# Patient Record
Sex: Female | Born: 1985 | Race: White | Hispanic: No | Marital: Married | Smoking: Never smoker
Health system: Southern US, Community
[De-identification: ages and names within clinical notes are randomized; demographics above are authoritative.]

## PROBLEM LIST (undated history)

## (undated) DIAGNOSIS — I341 Nonrheumatic mitral (valve) prolapse: Secondary | ICD-10-CM

## (undated) DIAGNOSIS — I493 Ventricular premature depolarization: Secondary | ICD-10-CM

## (undated) HISTORY — PX: TONSILLECTOMY: SUR1361

## (undated) HISTORY — PX: AUGMENTATION MAMMAPLASTY: SUR837

---

## 2007-07-28 ENCOUNTER — Emergency Department: Payer: Self-pay | Admitting: Emergency Medicine

## 2007-07-28 ENCOUNTER — Other Ambulatory Visit: Payer: Self-pay

## 2007-07-30 ENCOUNTER — Ambulatory Visit: Payer: Self-pay | Admitting: Cardiology

## 2007-08-05 ENCOUNTER — Ambulatory Visit: Payer: Self-pay | Admitting: Cardiology

## 2007-08-05 ENCOUNTER — Encounter: Payer: Self-pay | Admitting: Cardiology

## 2007-08-21 ENCOUNTER — Ambulatory Visit: Payer: Self-pay | Admitting: Cardiology

## 2010-09-05 NOTE — Assessment & Plan Note (Signed)
Kathy Knight   Kathy Knight, Kathy Knight                       MRN:          [id number]  DATE:08/21/2007                            DOB:          12-17-1985    Ms. Kathy Knight comes in today for further management of her palpitations  and hypokalemia.   We obtained a 2-D echocardiogram which shows minimal mitral valve  prolapse of the posterior leaflet.  There was no significant mitral  regurgitation.   She has had very few palpitations since we last saw her.  We will check  a potassium level today.  There is no reason for her to be hypokalemia,  but she was when she went the ED at [facility].   PHYSICAL EXAMINATION:  Her blood pressure is 113/70.  Her pulse is 87  and regular.  Weight is 120.  The rest of her exam is unchanged.   I have spent time discussing mitral valve prolapse, its implications,  its association with palpitations and also her hypokalemia.  All  questions were answered.  SBE prophylaxis was not recommended.  We  specifically discussed that pregnancy should not be a problem, since she  is getting ready to get married.  We will check a potassium level today.     Thomas C. Daleen Squibb, MD, [facility]  Electronically Signed    TCW/MedQ  DD: 08/21/2007  DT: 08/21/2007  Job #: [id number]   cc:   Garey Ham MD

## 2010-09-05 NOTE — Assessment & Plan Note (Signed)
[facility] OFFICE NOTE   Kathy, Knight                       MRN:          [id number]  DATE:07/30/2007                            DOB:          1986/04/15    I was asked by Dr. Garey Ham to evaluate Kathy Knight for  palpitations.   She is 25 years of age, single white female recent graduate of [employer]  nursing school.  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   This past weekend she began to have palpitations.  She took her  stethoscope and listened, and it sounded like she was having PVCs.   She went to the emergency department at [facility].  I do not  have telemetry strips, but she was told that she was having frequent  unifocal PVCs.  There were no couplets or triplets, and no nonsustained  V-tach.   She has never had these before.  She has not had a history of any  cardiac disease.  No history of mitral valve prolapse.  She exercises on  a regular basis, walking her dog, and has no symptoms.   She had no recent URI, cold, infection.  She denies orthopnea, PND or  peripheral edema.  She had no syncope or presyncope.   Her laboratory data at [facility] was within normal limits,  including a CBC, electrolytes, except for her potassium was low at 3.3.  She had a TSH which was normal.  Her D-dimer was negative.  Pregnancy  test was negative and her chest x-ray showed no acute cardiopulmonary  disease.  EKG shows sinus tachycardia with occasional unifocal PVC.   PAST MEDICAL HISTORY:  Other than the above, she does not smoke, does  not drink, does not use recreational drugs.  She walks on a regular  basis.  She quit drinking caffeine since this event occurred.  She was  not heavy caffeine drinker.   SURGICAL HISTORY:  Tonsillectomy 1991, breast augmentation December  2008.   FAMILY HISTORY:  Negative for premature coronary disease or sudden  cardiac  death.   CURRENT MEDS:  Birth control only.   She is a Engineer, civil (consulting).  She has no children.   EXAM:  Her blood pressure is 104/78, her pulse is 100 and regular.  Her  EKG confirms sinus rhythm which is almost sinus tach.  There are no  acute changes.  Her PR, QRS, and QTC intervals are normal.  She is 5  feet three inches and weighs 120 pounds.  She is very pleasant.  HEENT:  Normocephalic, atraumatic.  PERRLA.  Extraocular is intact.  Sclerae are clear.  Facial symmetry is  normal.  Dentition satisfactory.  Neck is supple.  Carotid upstrokes are equal bilateral without bruits,  no JVD.  Thyroid is not enlarged.  Trachea is midline.  LUNGS:  Clear.  HEART:  Reveals a nondisplaced PMI.  Normal S1-S2 without murmur, gallop  or click.  Abdominal exam is soft, good bowel sounds.  No midline bruit.  EXTREMITIES:  No cyanosis, clubbing or edema.  Pulses are intact.  NEURO:  Exam is intact.  Skin is unremarkable.   ASSESSMENT:  1. New-onset, most likely benign palpitations rule out underlying      structural heart disease.  2. Hypokalemia.  This could lower the threshold for palpitations,      particularly in the setting of caffeine.   PLAN:  1. Potassium rich diet.  2. Follow up potassium when she follows with me in the office.  3. 2-D echocardiogram to out a structural heart disease.  4. Regular exercise, regular meals, and regular sleep.  5. Avoid stimulants.     Thomas C. Daleen Squibb, MD, [facility]  Electronically Signed    TCW/MedQ  DD: 07/30/2007  DT: 07/30/2007  Job #: [id number]   cc:   Dr. Garey Ham

## 2012-12-04 ENCOUNTER — Inpatient Hospital Stay: Payer: Self-pay | Admitting: Obstetrics & Gynecology

## 2012-12-04 LAB — CBC WITH DIFFERENTIAL/PLATELET
Eosinophil %: 0.1 %
HGB: 11.2 g/dL — ABNORMAL LOW (ref 12.0–16.0)
MCV: 75 fL — ABNORMAL LOW (ref 80–100)
Monocyte #: 0.8 x10 3/mm (ref 0.2–0.9)
Neutrophil #: 10.4 10*3/uL — ABNORMAL HIGH (ref 1.4–6.5)
RBC: 4.36 10*6/uL (ref 3.80–5.20)
RDW: 14.3 % (ref 11.5–14.5)
WBC: 13 10*3/uL — ABNORMAL HIGH (ref 3.6–11.0)

## 2014-07-28 ENCOUNTER — Encounter: Payer: Self-pay | Admitting: *Deleted

## 2014-08-31 NOTE — H&P (Signed)
L&D Evaluation:  History:  HPI 29 yo G1 at [redacted]w[redacted]d by [facility] of 12/19/2012 presenting with PROM at 23:00 on 12/03/12. +FM, no VB, +ctx  PNC uncomplicated.  O neg s/p rhogam at 28 weeks / RI / VZNI / HBsAg neg / RPR NR / HIV neg / 1-hr OGTT 119 / GBS negative   Presents with leaking fluid   Patient's Medical History No Chronic Illness   Patient's Surgical History breast augmentation bilaterally   Medications Pre Natal Vitamins   Allergies NKDA   Social History none   Family History Non-Contributory   ROS:  ROS All systems were reviewed.  HEENT, CNS, GI, GU, Respiratory, CV, Renal and Musculoskeletal systems were found to be normal.   Exam:  Vital Signs stable   Urine Protein not completed   General no apparent distress   Mental Status no increased work of breathing   Abdomen gravid, non-tender   Estimated Fetal Weight Average for gestational age   Back no CVAT   Edema no edema   Pelvic no external lesions, 1cm   Mebranes Ruptured   FHT normal rate with no decels   Ucx regular, q49min   Impression:  Impression PPROM pitocin augmentation   Plan:  Plan EFM/NST   Comments cervix rechecked 4-hr after admission 2.5/70/-2 continue pitocin   Electronic Signatures: Dorthula Nettles (MD)  (Signed 14-Aug-14 12:59)  Authored: L&D Evaluation   Last Updated: 14-Aug-14 12:59 by Dorthula Nettles (MD)

## 2015-11-25 DIAGNOSIS — Z01419 Encounter for gynecological examination (general) (routine) without abnormal findings: Secondary | ICD-10-CM | POA: Diagnosis not present

## 2015-11-25 DIAGNOSIS — Z124 Encounter for screening for malignant neoplasm of cervix: Secondary | ICD-10-CM | POA: Diagnosis not present

## 2015-11-25 LAB — HM PAP SMEAR: HM PAP: NEGATIVE

## 2015-12-16 DIAGNOSIS — N949 Unspecified condition associated with female genital organs and menstrual cycle: Secondary | ICD-10-CM | POA: Diagnosis not present

## 2016-02-02 DIAGNOSIS — Z3481 Encounter for supervision of other normal pregnancy, first trimester: Secondary | ICD-10-CM | POA: Diagnosis not present

## 2016-02-02 LAB — OB RESULTS CONSOLE HGB/HCT, BLOOD
HCT: 36 %
HEMOGLOBIN: 12.5 g/dL

## 2016-02-02 LAB — OB RESULTS CONSOLE HEPATITIS B SURFACE ANTIGEN: HEP B S AG: NEGATIVE

## 2016-02-02 LAB — OB RESULTS CONSOLE RPR: RPR: NONREACTIVE

## 2016-02-02 LAB — OB RESULTS CONSOLE ANTIBODY SCREEN: Antibody Screen: NEGATIVE

## 2016-02-02 LAB — SICKLE CELL SCREEN: Sickle Cell Screen: NORMAL

## 2016-02-02 LAB — OB RESULTS CONSOLE VARICELLA ZOSTER ANTIBODY, IGG: VARICELLA IGG: IMMUNE

## 2016-02-02 LAB — OB RESULTS CONSOLE HIV ANTIBODY (ROUTINE TESTING): HIV: NONREACTIVE

## 2016-02-02 LAB — OB RESULTS CONSOLE ABO/RH: RH Type: NEGATIVE

## 2016-02-02 LAB — OB RESULTS CONSOLE RUBELLA ANTIBODY, IGM: Rubella: IMMUNE

## 2016-02-02 LAB — OB RESULTS CONSOLE PLATELET COUNT: Platelets: 237 10*3/uL

## 2016-02-02 LAB — OB RESULTS CONSOLE GC/CHLAMYDIA
Chlamydia: NEGATIVE
Gonorrhea: NEGATIVE

## 2016-02-08 DIAGNOSIS — Z3A09 9 weeks gestation of pregnancy: Secondary | ICD-10-CM | POA: Diagnosis not present

## 2016-02-08 DIAGNOSIS — O200 Threatened abortion: Secondary | ICD-10-CM | POA: Diagnosis not present

## 2016-02-08 DIAGNOSIS — O360110 Maternal care for anti-D [Rh] antibodies, first trimester, not applicable or unspecified: Secondary | ICD-10-CM | POA: Diagnosis not present

## 2016-02-08 DIAGNOSIS — O26851 Spotting complicating pregnancy, first trimester: Secondary | ICD-10-CM | POA: Diagnosis not present

## 2016-02-21 DIAGNOSIS — H5213 Myopia, bilateral: Secondary | ICD-10-CM | POA: Diagnosis not present

## 2016-02-29 DIAGNOSIS — O43891 Other placental disorders, first trimester: Secondary | ICD-10-CM | POA: Diagnosis not present

## 2016-02-29 DIAGNOSIS — Z3A12 12 weeks gestation of pregnancy: Secondary | ICD-10-CM | POA: Diagnosis not present

## 2016-04-12 ENCOUNTER — Ambulatory Visit (HOSPITAL_BASED_OUTPATIENT_CLINIC_OR_DEPARTMENT_OTHER)
Admission: RE | Admit: 2016-04-12 | Discharge: 2016-04-12 | Disposition: A | Discharge status: Home / Self Care | Payer: 59 | Source: Ambulatory Visit | Attending: Maternal and Fetal Medicine | Admitting: Maternal and Fetal Medicine

## 2016-04-12 ENCOUNTER — Other Ambulatory Visit: Payer: Self-pay | Admitting: *Deleted

## 2016-04-12 ENCOUNTER — Ambulatory Visit
Admission: RE | Admit: 2016-04-12 | Discharge: 2016-04-12 | Disposition: A | Discharge status: Home / Self Care | Payer: Self-pay | Source: Ambulatory Visit | Attending: Maternal and Fetal Medicine | Admitting: Maternal and Fetal Medicine

## 2016-04-12 ENCOUNTER — Other Ambulatory Visit: Payer: Self-pay

## 2016-04-12 DIAGNOSIS — Z3689 Encounter for other specified antenatal screening: Secondary | ICD-10-CM

## 2016-04-12 DIAGNOSIS — O9989 Other specified diseases and conditions complicating pregnancy, childbirth and the puerperium: Secondary | ICD-10-CM | POA: Diagnosis not present

## 2016-04-12 DIAGNOSIS — Q790 Congenital diaphragmatic hernia: Secondary | ICD-10-CM | POA: Insufficient documentation

## 2016-04-12 DIAGNOSIS — O99891 Other specified diseases and conditions complicating pregnancy: Secondary | ICD-10-CM

## 2016-04-12 HISTORY — DX: Nonrheumatic mitral (valve) prolapse: I34.1

## 2016-04-12 HISTORY — DX: Ventricular premature depolarization: I49.3

## 2016-04-20 LAB — INFORMASEQ(SM) WITH XY ANALYSIS
FETAL NUMBER: 1
Fetal Fraction (%):: 19.8
Gestational Age at Collection: 18.3 weeks
PDF: 0
Weight: 124 [lb_av]

## 2016-04-27 ENCOUNTER — Telehealth: Payer: Self-pay | Admitting: Obstetrics and Gynecology

## 2016-04-27 NOTE — Addendum Note (Signed)
Encounter addended by: Donette Larry on: 04/27/2016 11:06 AM<BR>    Actions taken: Sign clinical note

## 2016-04-27 NOTE — Telephone Encounter (Signed)
See genetic counseling note from 04/12/2016 for detailed notes on these results.  Normal InformaSeq results were given to the patient by phone on 04/24/2016.  Though these results are normal, we cannot rule out these or other chromosome conditions or genetic syndromes in this pregnancy. Kathy Knight has transferred care to [facility]FM due to the finding of [facility] in this pregnancy.  Future notes about this pregnancy can be found in the [facility].  We can be reached at [phone].     Wilburt Finlay, MS, CGC

## 2016-04-27 NOTE — Progress Notes (Signed)
Referring Physician:  [facility] Length of Consultation: 30 minutes  At the time of her visit, Ms. Hennen and her husband met with Dr. 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  The ultrasound showed a left sided [facility] with the heart shifted to the right and the stomach in the thorax.  Within the limits of the ultrasound, all other fetal anatomy appeared normal. Dr. Ysidro Evert spoke at length about the effects of this condition on fetal development and the health concerns/treatment after delivery.  We talked about the association of [facility] with chromosome conditions in 10-20% of cases as well as the possibility of other genetic syndromes or associated birth defects.  The option of amniocentesis with chromosome analysis and microarray was offered, as was the option of cell free DNA testing to screen for trisomy 13, 18 and 21.  The patient was counseled about the risks, benefits and limitations of both of these testing options and elected to proceed with InformaSeq cell free DNA testing.  She declined amniocentesis at this time due to the risks of the procedure.  The couple reported no exposures or complications in the pregnancy which would be expected to increase the risk for birth defects.  A full three generation pedigree was not obtained, but the couple stated that there was no known family history of birth defects, genetic syndromes, recurrent pregnancy loss or developmental differences.  Management and surveillance for the remainder of the pregnancy was also reviewed with the couple.  They intend to continue to pregnancy and would not elected to end the pregnancy due to the diagnosis or any of the syndromes we discussed.  They would like to transfer care to [facility] for the remainder of the pregnancy, which was facilitated at this visit.  We would recommend a fetal echocardiogram after [redacted] weeks gestation, a  consultation with pediatric surgery and a fetal MRI later in the pregnancy to better assess lung development and prognosis.  Another ultrasound assessing fetal anatomy will likely be performed at the [facility] upon transfer of her care.  The patient was informed of the results of her recent InformaSeq testing (performed at [facility]) which yielded NEGATIVE results.  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  The sensitivity for trisomy 65, trisomy 39 and trisomy 38 using this testing are reported as 99.1%, 98.3% and 98.1% respectively.  Thus, while the results of this testing are highly accurate, they are not considered diagnostic at this time.  Should more definitive information be desired, the patient may still consider amniocentesis. As requested to know by the patient, sex chromosome analysis was included for this sample.  Results were consistent with a female fetus. This is predicted with >97% accuracy.  A maternal serum AFP only should be considered if screening for neural tube defects is desired.  We reviewed that though these screening test results are normal, which significantly reduces the chance for trisomy 3, 18 or 21 in this pregnancy, this testing cannot rule out these conditions completely, and it cannot assess the chance for other chromosome conditions that may be associated with congenital diaphragmatic hernia.  The option of amniocentesis with chromosome analysis and chromosomal microarray remains available if desired.  We can be reached at [phone].     Wilburt Finlay, MS, CGC

## 2016-05-04 DIAGNOSIS — Z3A21 21 weeks gestation of pregnancy: Secondary | ICD-10-CM | POA: Diagnosis not present

## 2016-05-04 DIAGNOSIS — Q790 Congenital diaphragmatic hernia: Secondary | ICD-10-CM | POA: Diagnosis not present

## 2016-05-04 DIAGNOSIS — Z3688 Encounter for antenatal screening for fetal macrosomia: Secondary | ICD-10-CM | POA: Diagnosis not present

## 2016-05-18 DIAGNOSIS — O358XX0 Maternal care for other (suspected) fetal abnormality and damage, not applicable or unspecified: Secondary | ICD-10-CM | POA: Diagnosis not present

## 2016-05-18 DIAGNOSIS — Q790 Congenital diaphragmatic hernia: Secondary | ICD-10-CM | POA: Diagnosis not present

## 2016-05-18 DIAGNOSIS — Z3A23 23 weeks gestation of pregnancy: Secondary | ICD-10-CM | POA: Diagnosis not present

## 2016-05-18 DIAGNOSIS — O359XX1 Maternal care for (suspected) fetal abnormality and damage, unspecified, fetus 1: Secondary | ICD-10-CM | POA: Diagnosis not present

## 2016-05-22 ENCOUNTER — Encounter: Payer: Self-pay | Admitting: Maternal and Fetal Medicine

## 2016-05-30 DIAGNOSIS — O358XX0 Maternal care for other (suspected) fetal abnormality and damage, not applicable or unspecified: Secondary | ICD-10-CM | POA: Diagnosis not present

## 2016-05-30 DIAGNOSIS — O368920 Maternal care for other specified fetal problems, second trimester, not applicable or unspecified: Secondary | ICD-10-CM | POA: Diagnosis not present

## 2016-05-30 DIAGNOSIS — O360920 Maternal care for other rhesus isoimmunization, second trimester, not applicable or unspecified: Secondary | ICD-10-CM | POA: Diagnosis not present

## 2016-05-31 DIAGNOSIS — O358XX0 Maternal care for other (suspected) fetal abnormality and damage, not applicable or unspecified: Secondary | ICD-10-CM | POA: Diagnosis not present

## 2016-05-31 DIAGNOSIS — O368920 Maternal care for other specified fetal problems, second trimester, not applicable or unspecified: Secondary | ICD-10-CM | POA: Diagnosis not present

## 2016-05-31 DIAGNOSIS — Q790 Congenital diaphragmatic hernia: Secondary | ICD-10-CM | POA: Diagnosis not present

## 2016-05-31 DIAGNOSIS — Z3A25 25 weeks gestation of pregnancy: Secondary | ICD-10-CM | POA: Diagnosis not present

## 2016-06-07 LAB — INFORMASEQ(SM) WITH XY ANALYSIS
FETAL FRACTION (%): 19.8
Fetal Number: 1
GESTATIONAL AGE AT COLLECTION: 18.3 wk
WEIGHT: 124 [lb_av]

## 2016-06-18 DIAGNOSIS — O0993 Supervision of high risk pregnancy, unspecified, third trimester: Secondary | ICD-10-CM | POA: Diagnosis not present

## 2016-06-18 DIAGNOSIS — O358XX0 Maternal care for other (suspected) fetal abnormality and damage, not applicable or unspecified: Secondary | ICD-10-CM | POA: Diagnosis not present

## 2016-06-18 DIAGNOSIS — Q790 Congenital diaphragmatic hernia: Secondary | ICD-10-CM | POA: Diagnosis not present

## 2016-06-18 DIAGNOSIS — O09893 Supervision of other high risk pregnancies, third trimester: Secondary | ICD-10-CM | POA: Diagnosis not present

## 2016-06-18 DIAGNOSIS — Z3A28 28 weeks gestation of pregnancy: Secondary | ICD-10-CM | POA: Diagnosis not present

## 2016-06-18 DIAGNOSIS — Z363 Encounter for antenatal screening for malformations: Secondary | ICD-10-CM | POA: Diagnosis not present

## 2016-06-18 DIAGNOSIS — O9989 Other specified diseases and conditions complicating pregnancy, childbirth and the puerperium: Secondary | ICD-10-CM | POA: Diagnosis not present

## 2016-06-18 DIAGNOSIS — Z23 Encounter for immunization: Secondary | ICD-10-CM | POA: Diagnosis not present

## 2016-06-18 DIAGNOSIS — O360130 Maternal care for anti-D [Rh] antibodies, third trimester, not applicable or unspecified: Secondary | ICD-10-CM | POA: Diagnosis not present

## 2016-06-18 DIAGNOSIS — Z3A27 27 weeks gestation of pregnancy: Secondary | ICD-10-CM | POA: Diagnosis not present

## 2016-06-27 DIAGNOSIS — O358XX1 Maternal care for other (suspected) fetal abnormality and damage, fetus 1: Secondary | ICD-10-CM | POA: Diagnosis not present

## 2016-06-27 DIAGNOSIS — Q790 Congenital diaphragmatic hernia: Secondary | ICD-10-CM | POA: Diagnosis not present

## 2016-06-29 DIAGNOSIS — O358XX0 Maternal care for other (suspected) fetal abnormality and damage, not applicable or unspecified: Secondary | ICD-10-CM | POA: Diagnosis not present

## 2016-06-29 DIAGNOSIS — O359XX1 Maternal care for (suspected) fetal abnormality and damage, unspecified, fetus 1: Secondary | ICD-10-CM | POA: Diagnosis not present

## 2016-06-30 DIAGNOSIS — O35FXX1 Maternal care for other (suspected) fetal abnormality and damage, fetal musculoskeletal anomalies of trunk, fetus 1: Secondary | ICD-10-CM | POA: Insufficient documentation

## 2016-07-18 DIAGNOSIS — O358XX0 Maternal care for other (suspected) fetal abnormality and damage, not applicable or unspecified: Secondary | ICD-10-CM | POA: Diagnosis not present

## 2016-07-18 DIAGNOSIS — O09893 Supervision of other high risk pregnancies, third trimester: Secondary | ICD-10-CM | POA: Diagnosis not present

## 2016-07-18 DIAGNOSIS — Z3A21 21 weeks gestation of pregnancy: Secondary | ICD-10-CM | POA: Diagnosis not present

## 2016-07-18 DIAGNOSIS — Q790 Congenital diaphragmatic hernia: Secondary | ICD-10-CM | POA: Diagnosis not present

## 2016-07-18 DIAGNOSIS — Z3A32 32 weeks gestation of pregnancy: Secondary | ICD-10-CM | POA: Diagnosis not present

## 2016-07-18 DIAGNOSIS — O358XX1 Maternal care for other (suspected) fetal abnormality and damage, fetus 1: Secondary | ICD-10-CM | POA: Diagnosis not present

## 2016-07-27 DIAGNOSIS — O358XX0 Maternal care for other (suspected) fetal abnormality and damage, not applicable or unspecified: Secondary | ICD-10-CM | POA: Diagnosis not present

## 2016-07-27 DIAGNOSIS — Z363 Encounter for antenatal screening for malformations: Secondary | ICD-10-CM | POA: Diagnosis not present

## 2016-07-27 DIAGNOSIS — O403XX0 Polyhydramnios, third trimester, not applicable or unspecified: Secondary | ICD-10-CM | POA: Diagnosis not present

## 2016-07-27 DIAGNOSIS — Z3A33 33 weeks gestation of pregnancy: Secondary | ICD-10-CM | POA: Diagnosis not present

## 2016-07-27 DIAGNOSIS — Q897 Multiple congenital malformations, not elsewhere classified: Secondary | ICD-10-CM | POA: Diagnosis not present

## 2016-07-30 ENCOUNTER — Other Ambulatory Visit: Payer: Self-pay | Admitting: *Deleted

## 2016-07-30 DIAGNOSIS — O9989 Other specified diseases and conditions complicating pregnancy, childbirth and the puerperium: Principal | ICD-10-CM

## 2016-07-30 DIAGNOSIS — Q790 Congenital diaphragmatic hernia: Secondary | ICD-10-CM

## 2016-08-01 DIAGNOSIS — G43009 Migraine without aura, not intractable, without status migrainosus: Secondary | ICD-10-CM

## 2016-08-01 DIAGNOSIS — Z6741 Type O blood, Rh negative: Secondary | ICD-10-CM | POA: Insufficient documentation

## 2016-08-02 ENCOUNTER — Ambulatory Visit
Admission: RE | Admit: 2016-08-02 | Discharge: 2016-08-02 | Disposition: A | Discharge status: Home / Self Care | Payer: 59 | Source: Ambulatory Visit | Attending: Obstetrics & Gynecology | Admitting: Obstetrics & Gynecology

## 2016-08-02 DIAGNOSIS — Z6741 Type O blood, Rh negative: Secondary | ICD-10-CM

## 2016-08-02 DIAGNOSIS — Q790 Congenital diaphragmatic hernia: Secondary | ICD-10-CM | POA: Diagnosis not present

## 2016-08-02 DIAGNOSIS — O9989 Other specified diseases and conditions complicating pregnancy, childbirth and the puerperium: Secondary | ICD-10-CM | POA: Diagnosis not present

## 2016-08-02 DIAGNOSIS — Z3A34 34 weeks gestation of pregnancy: Secondary | ICD-10-CM | POA: Diagnosis not present

## 2016-08-02 DIAGNOSIS — O99891 Other specified diseases and conditions complicating pregnancy: Secondary | ICD-10-CM | POA: Insufficient documentation

## 2016-08-02 NOTE — Progress Notes (Signed)
[facility] presents for fetal testing in setting of pregnancy complicated by [facility]. She is moving to [place] as she plans to deliver at [facility] in [place].  Good FM. See [place] report for AFI.  NST: Baseline 125, moderate variability, reactive, no decels No contractions.  Saulo Anthis, [place] A, MD

## 2016-08-06 DIAGNOSIS — O403XX0 Polyhydramnios, third trimester, not applicable or unspecified: Secondary | ICD-10-CM | POA: Diagnosis not present

## 2016-08-06 DIAGNOSIS — O358XX1 Maternal care for other (suspected) fetal abnormality and damage, fetus 1: Secondary | ICD-10-CM | POA: Diagnosis not present

## 2016-08-06 DIAGNOSIS — O360930 Maternal care for other rhesus isoimmunization, third trimester, not applicable or unspecified: Secondary | ICD-10-CM | POA: Diagnosis not present

## 2016-08-06 DIAGNOSIS — O358XX0 Maternal care for other (suspected) fetal abnormality and damage, not applicable or unspecified: Secondary | ICD-10-CM | POA: Diagnosis not present

## 2016-08-07 DIAGNOSIS — O358XX0 Maternal care for other (suspected) fetal abnormality and damage, not applicable or unspecified: Secondary | ICD-10-CM | POA: Diagnosis not present

## 2016-08-07 DIAGNOSIS — O358XX1 Maternal care for other (suspected) fetal abnormality and damage, fetus 1: Secondary | ICD-10-CM | POA: Diagnosis not present

## 2016-08-13 DIAGNOSIS — Z3A35 35 weeks gestation of pregnancy: Secondary | ICD-10-CM | POA: Diagnosis not present

## 2016-08-13 DIAGNOSIS — O368930 Maternal care for other specified fetal problems, third trimester, not applicable or unspecified: Secondary | ICD-10-CM | POA: Diagnosis not present

## 2016-08-13 DIAGNOSIS — O358XX0 Maternal care for other (suspected) fetal abnormality and damage, not applicable or unspecified: Secondary | ICD-10-CM | POA: Diagnosis not present

## 2016-08-14 DIAGNOSIS — O403XX0 Polyhydramnios, third trimester, not applicable or unspecified: Secondary | ICD-10-CM | POA: Diagnosis not present

## 2016-08-14 DIAGNOSIS — O360930 Maternal care for other rhesus isoimmunization, third trimester, not applicable or unspecified: Secondary | ICD-10-CM | POA: Diagnosis not present

## 2016-08-14 DIAGNOSIS — O99323 Drug use complicating pregnancy, third trimester: Secondary | ICD-10-CM | POA: Diagnosis not present

## 2016-08-14 DIAGNOSIS — O358XX0 Maternal care for other (suspected) fetal abnormality and damage, not applicable or unspecified: Secondary | ICD-10-CM | POA: Diagnosis not present

## 2016-08-14 DIAGNOSIS — O368930 Maternal care for other specified fetal problems, third trimester, not applicable or unspecified: Secondary | ICD-10-CM | POA: Diagnosis not present

## 2016-08-20 DIAGNOSIS — O360930 Maternal care for other rhesus isoimmunization, third trimester, not applicable or unspecified: Secondary | ICD-10-CM | POA: Diagnosis not present

## 2016-08-20 DIAGNOSIS — O358XX0 Maternal care for other (suspected) fetal abnormality and damage, not applicable or unspecified: Secondary | ICD-10-CM | POA: Diagnosis not present

## 2016-08-20 DIAGNOSIS — O368930 Maternal care for other specified fetal problems, third trimester, not applicable or unspecified: Secondary | ICD-10-CM | POA: Diagnosis not present

## 2016-08-20 DIAGNOSIS — O403XX0 Polyhydramnios, third trimester, not applicable or unspecified: Secondary | ICD-10-CM | POA: Diagnosis not present

## 2016-08-21 DIAGNOSIS — O358XX0 Maternal care for other (suspected) fetal abnormality and damage, not applicable or unspecified: Secondary | ICD-10-CM | POA: Diagnosis not present

## 2016-08-21 DIAGNOSIS — D62 Acute posthemorrhagic anemia: Secondary | ICD-10-CM | POA: Diagnosis not present

## 2016-08-21 DIAGNOSIS — O358XX1 Maternal care for other (suspected) fetal abnormality and damage, fetus 1: Secondary | ICD-10-CM | POA: Diagnosis not present

## 2016-08-21 DIAGNOSIS — O9902 Anemia complicating childbirth: Secondary | ICD-10-CM | POA: Diagnosis not present

## 2016-08-21 DIAGNOSIS — Z3A37 37 weeks gestation of pregnancy: Secondary | ICD-10-CM | POA: Diagnosis not present

## 2016-08-21 DIAGNOSIS — O26893 Other specified pregnancy related conditions, third trimester: Secondary | ICD-10-CM | POA: Diagnosis not present

## 2016-08-21 DIAGNOSIS — O368930 Maternal care for other specified fetal problems, third trimester, not applicable or unspecified: Secondary | ICD-10-CM | POA: Diagnosis not present

## 2016-10-03 DIAGNOSIS — O9903 Anemia complicating the puerperium: Secondary | ICD-10-CM | POA: Diagnosis not present

## 2016-10-10 DIAGNOSIS — Z3483 Encounter for supervision of other normal pregnancy, third trimester: Secondary | ICD-10-CM | POA: Diagnosis not present

## 2016-10-10 DIAGNOSIS — Z3482 Encounter for supervision of other normal pregnancy, second trimester: Secondary | ICD-10-CM | POA: Diagnosis not present

## 2016-11-13 ENCOUNTER — Telehealth: Payer: Self-pay | Admitting: Obstetrics & Gynecology

## 2016-11-13 NOTE — Telephone Encounter (Signed)
Pt coming 8/10 at 2:10 with [facility] for mirena insert.

## 2016-11-16 NOTE — Telephone Encounter (Signed)
Noted. Will order to arrive by apt date/time. 

## 2016-11-30 ENCOUNTER — Ambulatory Visit (INDEPENDENT_AMBULATORY_CARE_PROVIDER_SITE_OTHER): Payer: 59 | Admitting: Obstetrics & Gynecology

## 2016-11-30 ENCOUNTER — Encounter: Payer: Self-pay | Admitting: Obstetrics & Gynecology

## 2016-11-30 VITALS — BP 110/76 | HR 88 | Ht 63.0 in | Wt 133.0 lb

## 2016-11-30 DIAGNOSIS — Z975 Presence of (intrauterine) contraceptive device: Secondary | ICD-10-CM

## 2016-11-30 NOTE — Progress Notes (Signed)
  IUD PROCEDURE NOTE:  Kathy Knight is a 31 y.o. [id number] here for IUD insertion. No GYN concerns.  Last pap smear was normal.  Delivered in May, infant receiving care for diaphragmatic hernia.  No further pregnancies desired.  Breast feeding.  IUD Insertion Procedure Note Patient identified, informed consent performed, consent signed.   Discussed risks of irregular bleeding, cramping, infection, malpositioning or misplacement of the IUD outside the uterus which may require further procedure such as laparoscopy, risk of failure <1%. Time out was performed.  Urine pregnancy test negative.  A bimanual exam showed the uterus to be midposition.  Speculum placed in the vagina.  Cervix visualized.  Cleaned with Betadine x 2.  Grasped anteriorly with a single tooth tenaculum.  Uterus sounded to 7 cm.   IUD placed per manufacturer's recommendations.  Strings trimmed to 3 cm. Tenaculum was removed, good hemostasis noted.  Patient tolerated procedure well.   Patient was given post-procedure instructions.  She was advised to have backup contraception for one week.  Patient was also asked to check IUD strings periodically and follow up in 4 weeks for IUD check.  Barnett Applebaum, MD, [facility], [facility] 11/30/2016  2:47 PM

## 2016-12-03 DIAGNOSIS — Z3483 Encounter for supervision of other normal pregnancy, third trimester: Secondary | ICD-10-CM | POA: Diagnosis not present

## 2016-12-03 DIAGNOSIS — Z3482 Encounter for supervision of other normal pregnancy, second trimester: Secondary | ICD-10-CM | POA: Diagnosis not present

## 2016-12-07 ENCOUNTER — Ambulatory Visit: Payer: 59 | Admitting: Psychology

## 2017-01-01 ENCOUNTER — Ambulatory Visit: Payer: 59 | Admitting: Obstetrics & Gynecology

## 2017-01-07 ENCOUNTER — Ambulatory Visit (INDEPENDENT_AMBULATORY_CARE_PROVIDER_SITE_OTHER): Payer: 59 | Admitting: Obstetrics & Gynecology

## 2017-01-07 ENCOUNTER — Encounter: Payer: Self-pay | Admitting: Obstetrics & Gynecology

## 2017-01-07 VITALS — BP 120/80 | HR 81 | Ht 64.0 in | Wt 133.0 lb

## 2017-01-07 DIAGNOSIS — Z30431 Encounter for routine checking of intrauterine contraceptive device: Secondary | ICD-10-CM

## 2017-01-07 NOTE — Progress Notes (Signed)
  History of Present Illness:  Kathy Knight is a 31 y.o. that had a Mirena IUD placed approximately 5 weeks ago. Since that time, she states that she has had intermittant light bleeding  PMHx: She  has a past medical history of Mitral valve prolapse and PVC (premature ventricular contraction). Also,  has a past surgical history that includes Augmentation mammaplasty and Tonsillectomy., family history includes Heart disease in her maternal grandmother and paternal grandmother; Hyperlipidemia in her maternal grandmother, mother, and paternal grandmother; Pulmonary fibrosis in her father; Thyroid disease in her maternal aunt and mother.,  reports that she has never smoked. She has never used smokeless tobacco. She reports that she does not drink alcohol or use drugs. No outpatient prescriptions have been marked as taking for the 01/07/17 encounter (Office Visit) with Gae Dry, MD.  .  Also, has No Known Allergies..  Review of Systems  All other systems reviewed and are negative.  Physical Exam:  BP 120/80   Pulse 81   Ht 5\' 4"  (1.626 m)   Wt 133 lb (60.3 kg)   BMI 22.83 kg/m  Body mass index is 22.83 kg/m. Constitutional: Well nourished, well developed female in no acute distress.  Abdomen: diffusely non tender to palpation, non distended, and no masses, hernias Neuro: Grossly intact Psych:  Normal mood and affect.    Pelvic exam:  Two IUD strings present seen coming from the cervical os. EGBUS, vaginal vault and cervix: within normal limits  Assessment: IUD strings present in proper location; pt doing well  Plan: She was told to continue to use barrier contraception, in order to prevent any STIs, and to take a home pregnancy test or call us if she ever thinks she may be pregnant, and that her IUD expires in 5 years.  She was amenable to this plan and we will see her back in 1 year/PRN.  Barnett Applebaum, MD, [facility], [facility] 01/07/2017  3:30 PM

## 2017-02-20 DIAGNOSIS — Z3483 Encounter for supervision of other normal pregnancy, third trimester: Secondary | ICD-10-CM | POA: Diagnosis not present

## 2017-02-20 DIAGNOSIS — Z3482 Encounter for supervision of other normal pregnancy, second trimester: Secondary | ICD-10-CM | POA: Diagnosis not present

## 2017-04-18 DIAGNOSIS — Z3482 Encounter for supervision of other normal pregnancy, second trimester: Secondary | ICD-10-CM | POA: Diagnosis not present

## 2017-04-18 DIAGNOSIS — Z3483 Encounter for supervision of other normal pregnancy, third trimester: Secondary | ICD-10-CM | POA: Diagnosis not present

## 2017-05-25 ENCOUNTER — Ambulatory Visit
Admission: EM | Admit: 2017-05-25 | Discharge: 2017-05-25 | Disposition: A | Discharge status: Home / Self Care | Payer: 59 | Attending: Emergency Medicine | Admitting: Emergency Medicine

## 2017-05-25 ENCOUNTER — Encounter: Payer: Self-pay | Admitting: Gynecology

## 2017-05-25 ENCOUNTER — Other Ambulatory Visit: Payer: Self-pay

## 2017-05-25 DIAGNOSIS — J0100 Acute maxillary sinusitis, unspecified: Secondary | ICD-10-CM

## 2017-05-25 DIAGNOSIS — M26621 Arthralgia of right temporomandibular joint: Secondary | ICD-10-CM

## 2017-05-25 MED ORDER — HYDROCOD POLST-CPM POLST ER 10-8 MG/5ML PO SUER: 5.0000 mL | Freq: Two times a day (BID) | ORAL | 0 refills | Status: DC | PRN

## 2017-05-25 MED ORDER — DOXYCYCLINE HYCLATE 100 MG PO CAPS: 100.0000 mg | ORAL_CAPSULE | Freq: Two times a day (BID) | ORAL | 0 refills | Status: AC

## 2017-05-25 MED ORDER — PREDNISONE 20 MG PO TABS: 40.0000 mg | ORAL_TABLET | Freq: Every day | ORAL | 0 refills | Status: AC

## 2017-05-25 MED ORDER — BENZONATATE 200 MG PO CAPS: 200.0000 mg | ORAL_CAPSULE | Freq: Three times a day (TID) | ORAL | 0 refills | Status: DC | PRN

## 2017-05-25 MED ORDER — FLUTICASONE PROPIONATE 50 MCG/ACT NA SUSP: 2.0000 | Freq: Every day | NASAL | 0 refills | Status: DC

## 2017-05-25 MED ORDER — CYCLOBENZAPRINE HCL 10 MG PO TABS: 10.0000 mg | ORAL_TABLET | Freq: Every day | ORAL | 0 refills | Status: DC

## 2017-05-25 NOTE — Discharge Instructions (Signed)
Take the medication as written. Start Mucinex to keep the mucous thin and to decongest you.  Return to the ER if you get worse, have a fever >100.4, or for any concerns. You may take 600 mg of motrin with 1 gram of tylenol up to 3-4 times a day as needed for pain. This is an effective combination for pain.  Use a NeilMed sinus rinse as often as you want to to reduce nasal congestion. Follow the directions on the box.   Go to www.goodrx.com to look up your medications. This will give you a list of where you can find your prescriptions at the most affordable prices. Or you can ask the pharmacist what the cash price is. This is frequently cheaper than going through insurance.

## 2017-05-25 NOTE — ED Triage Notes (Signed)
Per patient c/o cough x 10 days. Pt. C/o right ear pain / congestion and sore throat.

## 2017-05-25 NOTE — ED Provider Notes (Addendum)
HPI  SUBJECTIVE:  Kathy Knight is a 32 y.o. female who presents with greenish nasal congestion, rhinorrhea, postnasal drip, sinus pain and pressure, nonproductive cough, chest congestion and dull constant right ear pain that occasionally becomes sharp, stabbing.  This is been going on for 10 days.  States that she was getting better and then got acutely worse several days ago.  She states that her right ear is tender to palpation.  States that she cannot sleep secondary to cough.  She reports headaches, chills, feeling feverish but has no documented fevers.  No upper dental pain.  She has been taking ibuprofen 600 mg on a regular basis with some improvement in her symptoms.  She has also tried Delsym and Robitussin.  Symptoms are worse with lying down.  She denies wheezing, chest pain, shortness of breath, dyspnea on exertion.  No allergy or GERD symptoms.  No posttussive emesis.  She denies recent swimming, foreign body insertion, ear pain with chewing, yawning.  No change in hearing, otorrhea.  States that she does grind her teeth at night.  She is currently breast-feeding.  She has no past medical history of asthma, smoking, diabetes, hypertension, GERD, allergies, TMJ arthralgias.  LMP: Last year.  She denies the possibility of being pregnant.  Has an IUD.  GEX:BMWUXLK, Estill Bamberg, MD   Past Medical History:  Diagnosis Date  . Mitral valve prolapse   . PVC (premature ventricular contraction)     Past Surgical History:  Procedure Laterality Date  . AUGMENTATION MAMMAPLASTY    . TONSILLECTOMY      Family History  Problem Relation Age of Onset  . Hyperlipidemia Mother   . Thyroid disease Mother   . Pulmonary fibrosis Father   . Thyroid disease Maternal Aunt   . Heart disease Maternal Grandmother   . Hyperlipidemia Maternal Grandmother   . Heart disease Paternal Grandmother   . Hyperlipidemia Paternal Grandmother     Social History   Tobacco Use  . Smoking status: Never Smoker  .  Smokeless tobacco: Never Used  Substance Use Topics  . Alcohol use: No  . Drug use: No    No current facility-administered medications for this encounter.   Current Outpatient Medications:  .  Prenatal Vit-Fe Fumarate-FA (MULTIVITAMIN-PRENATAL) 27-0.8 MG TABS tablet, Take 1 tablet by mouth daily at 12 noon., Disp: , Rfl:  .  benzonatate (TESSALON) 200 MG capsule, Take 1 capsule (200 mg total) by mouth 3 (three) times daily as needed for cough., Disp: 30 capsule, Rfl: 0 .  chlorpheniramine-HYDROcodone (TUSSIONEX PENNKINETIC ER) 10-8 MG/5ML SUER, Take 5 mLs by mouth every 12 (twelve) hours as needed for cough., Disp: 120 mL, Rfl: 0 .  cyclobenzaprine (FLEXERIL) 10 MG tablet, Take 1 tablet (10 mg total) by mouth at bedtime., Disp: 20 tablet, Rfl: 0 .  doxycycline (VIBRAMYCIN) 100 MG capsule, Take 1 capsule (100 mg total) by mouth 2 (two) times daily for 7 days., Disp: 14 capsule, Rfl: 0 .  fluticasone (FLONASE) 50 MCG/ACT nasal spray, Place 2 sprays into both nostrils daily., Disp: 16 g, Rfl: 0 .  predniSONE (DELTASONE) 20 MG tablet, Take 2 tablets (40 mg total) by mouth daily with breakfast for 5 days., Disp: 10 tablet, Rfl: 0  No Known Allergies   ROS  As noted in HPI.   Physical Exam  BP 109/74 (BP Location: Left Arm)   Pulse 100   Temp 98.6 F (37 C) (Oral)   Resp 16   Ht 5\' 3"  (1.6  m)   Wt 130 lb (59 kg)   SpO2 100%   Breastfeeding? Yes   BMI 23.03 kg/m   Constitutional: Well developed, well nourished, no acute distress Eyes:  EOMI, conjunctiva normal bilaterally HENT: Normocephalic, atraumatic,mucus membranes moist.  Right external ear normal.  No mastoid tenderness.  No pain with traction on the pinna.  Positive pain with palpation of the tragus.  External ear canal normal.  TMs normal.  Positive tenderness, crepitus at the right TMJ.  No tenderness to the left TMJ.  Erythematous, swollen, turbinates with clear nasal congestion.  Positive maxillary sinus tenderness.  No  frontal sinus tenderness.  Positive postnasal drip, cobblestoning.  Tonsils surgically absent.  Uvula midline, no oropharynx. Neck: Positive bilateral cervical lymphadenopathy Respiratory: Normal inspiratory effort, lungs clear bilaterally.  Good air movement. Cardiovascular: Normal rate GI: nondistended skin: No rash, skin intact Musculoskeletal: no deformities Neurologic: Alert & oriented x 3, no focal neuro deficits Psychiatric: Speech and behavior appropriate   ED Course   Medications - No data to display  No orders of the defined types were placed in this encounter.   No results found for this or any previous visit (from the past 24 hour(s)). No results found.  ED Clinical Impression  Acute non-recurrent maxillary sinusitis  Arthralgia of right temporomandibular joint   ED Assessment/Plan  [place] Narcotic database reviewed for this patient, and feel that the risk/benefit ratio today is favorable for proceeding with a prescription for controlled substance.  No opiate prescriptions in 2 years.  Patient with a sinusitis and given the history of double sickening and the duration of symptoms, feel that antibiotics would be reasonable.  We will sent home with doxycycline, saline nasal irrigation, Flonase, Tussionex for the cough.  Patient will pump and dump while taking the Tussionex.  Doubt pneumonia although the doxycycline will also cover this.  Her lungs are clear.  No bronchodilators because she has no history of asthma.  Also the ear pain is most likely from temporomandibular joint arthralgia.  She does have crepitus and tenderness over the TMJ.  Will send home with prednisone 40 mg for 5 days, Flexeril.  The Tylenol and ibuprofen will help with this.  Follow-up with her OB/GYN or may return here as needed.  Patient agrees with plan and will follow-up as needed.  All of these drugs were checked for safety in lactation through epocrates.  Meds ordered this encounter  Medications   . cyclobenzaprine (FLEXERIL) 10 MG tablet    Sig: Take 1 tablet (10 mg total) by mouth at bedtime.    Dispense:  20 tablet    Refill:  0  . doxycycline (VIBRAMYCIN) 100 MG capsule    Sig: Take 1 capsule (100 mg total) by mouth 2 (two) times daily for 7 days.    Dispense:  14 capsule    Refill:  0  . fluticasone (FLONASE) 50 MCG/ACT nasal spray    Sig: Place 2 sprays into both nostrils daily.    Dispense:  16 g    Refill:  0  . predniSONE (DELTASONE) 20 MG tablet    Sig: Take 2 tablets (40 mg total) by mouth daily with breakfast for 5 days.    Dispense:  10 tablet    Refill:  0  . chlorpheniramine-HYDROcodone (TUSSIONEX PENNKINETIC ER) 10-8 MG/5ML SUER    Sig: Take 5 mLs by mouth every 12 (twelve) hours as needed for cough.    Dispense:  120 mL    Refill:  0  . benzonatate (TESSALON) 200 MG capsule    Sig: Take 1 capsule (200 mg total) by mouth 3 (three) times daily as needed for cough.    Dispense:  30 capsule    Refill:  0     05/26/2017.  1300-patient states that she called the lactation consultant and lactation consultants recommends tetracycline rather than doxycycline.  Will switch patient to Augmentin 875/125 mg p.o. twice daily for 10 days.  This is safe while breast-feeding per epocrates.  Will Have staff contact the patient and notify her of the medicine change and have them call the rx in to the patient's pharmacy of choice.  *This clinic note was created using Dragon dictation software. Therefore, there may be occasional mistakes despite careful proofreading.   ?   Melynda Ripple, MD 05/25/17 1704    Melynda Ripple, MD 05/26/17 [place], MD 05/26/17 1304

## 2017-05-26 ENCOUNTER — Telehealth: Payer: Self-pay

## 2017-05-26 MED ORDER — AMOXICILLIN-POT CLAVULANATE 875-125 MG PO TABS: 1.0000 | ORAL_TABLET | Freq: Two times a day (BID) | ORAL | 0 refills | Status: DC

## 2017-05-26 NOTE — Telephone Encounter (Signed)
Patient called in today reporting that Doxycycline may be an issue while breastfeeding. While talking with Dr. Alphonzo Cruise and Dr. Lacinda Axon they decided to switch patient to Augmentin. I have sent to [facility] in [place] per patient request, patient advised.

## 2017-05-28 ENCOUNTER — Telehealth: Payer: Self-pay | Admitting: Emergency Medicine

## 2017-05-28 NOTE — Telephone Encounter (Signed)
Called to follow up after patient's recent visit. Patient states she is improving, but not well yet. Advised follow up with PCP or Urgent Care if she doesn't get well. Patient agreed and voiced understanding.

## 2017-06-13 DIAGNOSIS — Z3483 Encounter for supervision of other normal pregnancy, third trimester: Secondary | ICD-10-CM | POA: Diagnosis not present

## 2017-06-13 DIAGNOSIS — Z3482 Encounter for supervision of other normal pregnancy, second trimester: Secondary | ICD-10-CM | POA: Diagnosis not present

## 2017-08-05 DIAGNOSIS — Z3482 Encounter for supervision of other normal pregnancy, second trimester: Secondary | ICD-10-CM | POA: Diagnosis not present

## 2017-08-05 DIAGNOSIS — Z3483 Encounter for supervision of other normal pregnancy, third trimester: Secondary | ICD-10-CM | POA: Diagnosis not present

## 2017-09-24 ENCOUNTER — Ambulatory Visit (INDEPENDENT_AMBULATORY_CARE_PROVIDER_SITE_OTHER): Payer: 59 | Admitting: Certified Nurse Midwife

## 2017-09-24 ENCOUNTER — Encounter: Payer: Self-pay | Admitting: Certified Nurse Midwife

## 2017-09-24 VITALS — BP 102/62 | HR 92 | Ht 63.0 in | Wt 132.0 lb

## 2017-09-24 DIAGNOSIS — Z13220 Encounter for screening for lipoid disorders: Secondary | ICD-10-CM | POA: Diagnosis not present

## 2017-09-24 DIAGNOSIS — Z3049 Encounter for surveillance of other contraceptives: Secondary | ICD-10-CM | POA: Diagnosis not present

## 2017-09-24 DIAGNOSIS — Z01419 Encounter for gynecological examination (general) (routine) without abnormal findings: Secondary | ICD-10-CM

## 2017-09-24 DIAGNOSIS — Z124 Encounter for screening for malignant neoplasm of cervix: Secondary | ICD-10-CM | POA: Diagnosis not present

## 2017-09-24 DIAGNOSIS — Z131 Encounter for screening for diabetes mellitus: Secondary | ICD-10-CM

## 2017-09-24 NOTE — Progress Notes (Signed)
Gynecology Annual Exam  PCP: Patient, No Pcp Per  Chief Complaint:  Chief Complaint  Patient presents with  . Gynecologic Exam    History of Present Illness:Kathy Knight is a 32 year old Caucasian/White female, G2 P2002, who presents for her annual exam. She is having no significant GYN problems.  Her menses are absent due to IUD. She is also still breast feeding her baby-now pumping once a day. She denies spotting  The patient's past medical history is detailed in the past medical history section.  Since her last annual GYN exam dated 11/25/2015 , she has had a vaginal delivery of a female infant [name]) who has a congenital diaphragmatic hernia. She is sexually active. She is currently using an IUD for contraception. Mirena IUD was placed on 11/30/2016  Her most recent pap smear was obtained 07/2015 and was with negative cells and negative HPV DNA.  Mammogram is not applicable.  There is no family history of breast cancer.  There is no family history of ovarian cancer.  The patient does do monthly self breast exams.  The patient does not smoke.  The patient does not drink alcohol  The patient does not use illegal drugs.  The patient exercises regularly.  The patient does get adequate calcium in her diet.  She has not had a recent cholesterol screen and is  interested in labwork  Review of Systems: Review of Systems  Constitutional: Negative for chills, fever and weight loss.  HENT: Negative for congestion, sinus pain and sore throat.   Eyes: Negative for blurred vision and pain.  Respiratory: Negative for hemoptysis, shortness of breath and wheezing.   Cardiovascular: Negative for chest pain, palpitations and leg swelling.  Gastrointestinal: Negative for abdominal pain, blood in stool, diarrhea, heartburn, nausea and vomiting.  Genitourinary: Negative for dysuria, frequency, hematuria and urgency.  Musculoskeletal: Negative for back pain, joint pain and myalgias.    Skin: Negative for itching and rash.  Neurological: Negative for dizziness, tingling and headaches.  Endo/Heme/Allergies: Negative for environmental allergies and polydipsia. Does not bruise/bleed easily.       Negative for hirsutism   Psychiatric/Behavioral: Negative for depression. The patient is not nervous/anxious and does not have insomnia.     Past Medical History:  Past Medical History:  Diagnosis Date  . Mitral valve prolapse   . PVC (premature ventricular contraction)     Past Surgical History:  Past Surgical History:  Procedure Laterality Date  . AUGMENTATION MAMMAPLASTY    . TONSILLECTOMY      Family History:  Family History  Problem Relation Age of Onset  . Hyperlipidemia Mother   . Thyroid disease Mother   . Anxiety disorder Mother   . Pulmonary fibrosis Father   . Thyroid disease Maternal Aunt   . Anxiety disorder Maternal Aunt   . Heart disease Maternal Grandmother   . Hyperlipidemia Maternal Grandmother   . Heart disease Paternal Grandmother   . Hyperlipidemia Paternal Grandmother   . Other Son        congenital diaphragmatic hernia    Social History:  Social History   Socioeconomic History  . Marital status: Married    Spouse name: Marylyn Ishihara  . Number of children: 2  . Years of education: Not on file  . Highest education level: Not on file  Occupational History  . Not on file  Social Needs  . Financial resource strain: Not on file  . Food insecurity:    Worry: Not  on file    Inability: Not on file  . Transportation needs:    Medical: Not on file    Non-medical: Not on file  Tobacco Use  . Smoking status: Never Smoker  . Smokeless tobacco: Never Used  Substance and Sexual Activity  . Alcohol use: No  . Drug use: No  . Sexual activity: Yes    Birth control/protection: IUD  Lifestyle  . Physical activity:    Days per week: 6 days    Minutes per session: 60 min  . Stress: Only a little  Relationships  . Social connections:    Talks on  phone: Not on file    Gets together: Not on file    Attends religious service: Not on file    Active member of club or organization: Not on file    Attends meetings of clubs or organizations: Not on file    Relationship status: Not on file  . Intimate partner violence:    Fear of current or ex partner: Not on file    Emotionally abused: Not on file    Physically abused: Not on file    Forced sexual activity: Not on file  Other Topics Concern  . Not on file  Social History Narrative  . Not on file    Allergies:  No Known Allergies  Medications:  Current Outpatient Medications on File Prior to Visit  Medication Sig Dispense Refill  . Prenatal Vit-Fe Fumarate-FA (MULTIVITAMIN-PRENATAL) 27-0.8 MG TABS tablet Take 1 tablet by mouth daily at 12 noon.     No current facility-administered medications on file prior to visit.   Physical Exam Vitals: BP 102/62   Pulse 92   Ht 5\' 3"  (1.6 m)   Wt 132 lb (59.9 kg)   Breastfeeding? Yes   BMI 23.38 kg/m   General: WF in NAD HEENT: normocephalic, anicteric Neck: no thyroid enlargement, no palpable nodules, no cervical lymphadenopathy  Pulmonary: No increased work of breathing, CTAB Cardiovascular: RRR, without murmur  Breasts: s/p breast augmentation,  no tenderness, no palpable nodules or masses, no skin or nipple retraction present, no nipple discharge.  No axillary, infraclavicular or supraclavicular lymphadenopathy. Abdomen: Soft, non-tender, non-distended.  Umbilicus without lesions.  No hepatomegaly or masses palpable. No evidence of hernia. Genitourinary:  External: Normal external female genitalia.  Normal urethral meatus, normal  Bartholin's and Skene's glands.    Vagina: Normal vaginal mucosa, no evidence of prolapse.    Cervix: Grossly normal in appearance, no bleeding, non-tender, IUD strings visible  Uterus: Anteverted, levoposed, normal size, shape, and consistency, mobile, and non-tender  Adnexa: No adnexal masses,  non-tender  Rectal: deferred  Lymphatic: no evidence of inguinal lymphadenopathy Extremities: no edema, erythema, or tenderness Neurologic: Grossly intact Psychiatric: mood appropriate, affect full     Assessment: 32 y.o. G2P2002 normal annual exam  Plan:   1) Breast cancer screening - recommend monthly self breast exam.   2) Cervical cancer screening - Pap was done. ASCCP guidelines and rational discussed.  Patient opts for yearly screening interval  3) Contraception - Mirena IUD  4) Routine healthcare maintenance including cholesterol and diabetes screening ordered today . TDAP UTD.  5) RTO 1 year and prn  Dalia Heading, CNM

## 2017-09-27 LAB — IGP,RFX APTIMA HPV ALL PTH: PAP Smear Comment: 0

## 2017-10-16 ENCOUNTER — Other Ambulatory Visit: Payer: 59

## 2017-10-16 DIAGNOSIS — Z13220 Encounter for screening for lipoid disorders: Secondary | ICD-10-CM | POA: Diagnosis not present

## 2017-10-16 DIAGNOSIS — Z131 Encounter for screening for diabetes mellitus: Secondary | ICD-10-CM

## 2017-10-17 LAB — LIPID PANEL WITH LDL/HDL RATIO
CHOLESTEROL TOTAL: 195 mg/dL (ref 100–199)
HDL: 61 mg/dL (ref >39)
LDL Calculated: 123 mg/dL — ABNORMAL HIGH (ref 0–99)
LDl/HDL Ratio: 2 ratio (ref 0.0–3.2)
Triglycerides: 53 mg/dL (ref 0–149)
VLDL Cholesterol Cal: 11 mg/dL (ref 5–40)

## 2017-10-17 LAB — HGB A1C W/O EAG: HEMOGLOBIN A1C: 5.4 % (ref 4.8–5.6)

## 2018-02-14 DIAGNOSIS — H169 Unspecified keratitis: Secondary | ICD-10-CM | POA: Diagnosis not present

## 2018-02-24 DIAGNOSIS — H169 Unspecified keratitis: Secondary | ICD-10-CM | POA: Diagnosis not present

## 2018-12-04 ENCOUNTER — Encounter: Payer: Self-pay | Admitting: Emergency Medicine

## 2018-12-04 ENCOUNTER — Emergency Department: Payer: 59

## 2018-12-04 ENCOUNTER — Other Ambulatory Visit: Payer: Self-pay

## 2018-12-04 ENCOUNTER — Emergency Department
Admission: EM | Admit: 2018-12-04 | Discharge: 2018-12-04 | Disposition: A | Discharge status: Home / Self Care | Payer: 59 | Attending: Emergency Medicine | Admitting: Emergency Medicine

## 2018-12-04 ENCOUNTER — Ambulatory Visit (INDEPENDENT_AMBULATORY_CARE_PROVIDER_SITE_OTHER)
Admission: RE | Admit: 2018-12-04 | Discharge: 2018-12-04 | Disposition: A | Discharge status: Home / Self Care | Payer: 59 | Source: Ambulatory Visit

## 2018-12-04 DIAGNOSIS — R109 Unspecified abdominal pain: Secondary | ICD-10-CM | POA: Diagnosis not present

## 2018-12-04 DIAGNOSIS — N200 Calculus of kidney: Secondary | ICD-10-CM | POA: Diagnosis not present

## 2018-12-04 DIAGNOSIS — M62830 Muscle spasm of back: Secondary | ICD-10-CM

## 2018-12-04 DIAGNOSIS — M545 Low back pain, unspecified: Secondary | ICD-10-CM

## 2018-12-04 DIAGNOSIS — M546 Pain in thoracic spine: Secondary | ICD-10-CM

## 2018-12-04 LAB — CBC
HCT: 45.3 % (ref 36.0–46.0)
Hemoglobin: 14.8 g/dL (ref 12.0–15.0)
MCH: 29.1 pg (ref 26.0–34.0)
MCHC: 32.7 g/dL (ref 30.0–36.0)
MCV: 89 fL (ref 80.0–100.0)
Platelets: 284 10*3/uL (ref 150–400)
RBC: 5.09 MIL/uL (ref 3.87–5.11)
RDW: 12.1 % (ref 11.5–15.5)
WBC: 7.1 10*3/uL (ref 4.0–10.5)
nRBC: 0 % (ref 0.0–0.2)

## 2018-12-04 LAB — URINALYSIS, COMPLETE (UACMP) WITH MICROSCOPIC
Bacteria, UA: NONE SEEN
Bilirubin Urine: NEGATIVE
Glucose, UA: NEGATIVE mg/dL
Hgb urine dipstick: NEGATIVE
Ketones, ur: NEGATIVE mg/dL
Leukocytes,Ua: NEGATIVE
Nitrite: NEGATIVE
Protein, ur: NEGATIVE mg/dL
Specific Gravity, Urine: 1.012 (ref 1.005–1.030)
pH: 6 (ref 5.0–8.0)

## 2018-12-04 LAB — COMPREHENSIVE METABOLIC PANEL
ALT: 13 U/L (ref 0–44)
AST: 21 U/L (ref 15–41)
Albumin: 4.9 g/dL (ref 3.5–5.0)
Alkaline Phosphatase: 46 U/L (ref 38–126)
Anion gap: 9 (ref 5–15)
BUN: 15 mg/dL (ref 6–20)
CO2: 25 mmol/L (ref 22–32)
Calcium: 9.2 mg/dL (ref 8.9–10.3)
Chloride: 102 mmol/L (ref 98–111)
Creatinine, Ser: 0.78 mg/dL (ref 0.44–1.00)
GFR calc Af Amer: >60 mL/min (ref >60)
GFR calc non Af Amer: >60 mL/min (ref >60)
Glucose, Bld: 133 mg/dL — ABNORMAL HIGH (ref 70–99)
Potassium: 3.4 mmol/L — ABNORMAL LOW (ref 3.5–5.1)
Sodium: 136 mmol/L (ref 135–145)
Total Bilirubin: 0.4 mg/dL (ref 0.3–1.2)
Total Protein: 7.7 g/dL (ref 6.5–8.1)

## 2018-12-04 LAB — CK: Total CK: 65 U/L (ref 38–234)

## 2018-12-04 LAB — LIPASE, BLOOD: Lipase: 38 U/L (ref 11–51)

## 2018-12-04 LAB — POCT PREGNANCY, URINE: Preg Test, Ur: NEGATIVE

## 2018-12-04 MED ORDER — SODIUM CHLORIDE 0.9 % IV BOLUS
1000.0000 mL | Freq: Once | INTRAVENOUS | Status: AC
  Administered 2018-12-04: 1000 mL via INTRAVENOUS

## 2018-12-04 MED ORDER — PREDNISONE 20 MG PO TABS: 20.0000 mg | ORAL_TABLET | Freq: Two times a day (BID) | ORAL | 0 refills | Status: AC

## 2018-12-04 MED ORDER — ONDANSETRON HCL 4 MG/2ML IJ SOLN
4.0000 mg | Freq: Once | INTRAMUSCULAR | Status: AC
  Administered 2018-12-04: 4 mg via INTRAVENOUS
  Filled 2018-12-04: qty 2

## 2018-12-04 MED ORDER — ONDANSETRON 8 MG PO TBDP: 8.0000 mg | ORAL_TABLET | Freq: Three times a day (TID) | ORAL | 0 refills | Status: DC | PRN

## 2018-12-04 MED ORDER — MORPHINE SULFATE (PF) 4 MG/ML IV SOLN
4.0000 mg | Freq: Once | INTRAVENOUS | Status: AC
  Administered 2018-12-04: 4 mg via INTRAVENOUS
  Filled 2018-12-04: qty 1

## 2018-12-04 MED ORDER — OXYCODONE-ACETAMINOPHEN 5-325 MG PO TABS
1.0000 | ORAL_TABLET | Freq: Once | ORAL | Status: AC
Start: 2018-12-04 — End: 2018-12-04
  Administered 2018-12-04: 1 via ORAL
  Filled 2018-12-04: qty 1

## 2018-12-04 MED ORDER — OXYCODONE-ACETAMINOPHEN 5-325 MG PO TABS: 1.0000 | ORAL_TABLET | Freq: Four times a day (QID) | ORAL | 0 refills | Status: AC | PRN

## 2018-12-04 MED ORDER — CYCLOBENZAPRINE HCL 5 MG PO TABS: 5.0000 mg | ORAL_TABLET | Freq: Every day | ORAL | 0 refills | Status: DC

## 2018-12-04 MED ORDER — OXYCODONE-ACETAMINOPHEN 5-325 MG PO TABS
1.0000 | ORAL_TABLET | Freq: Once | ORAL | Status: AC
  Administered 2018-12-04: 1 via ORAL
  Filled 2018-12-04: qty 1

## 2018-12-04 NOTE — ED Provider Notes (Signed)
[facility] Emergency Department Provider Note ____________________________________________   First MD Initiated Contact with Patient 12/04/18 2126     (approximate)  I have reviewed the triage vital signs and the nursing notes.   HISTORY  Chief Complaint Flank Pain    HPI Kathy Knight is a 33 y.o. female with PMH as noted below who presents with bilateral mid back pain that started 3 days ago, gradual onset, persistent course, and worse at night.  The patient states that she exercised 1 day prior to the pain starting but states that she did not do anything heavier than normal or unusual for her.  She states that over the last day the pain has started radiating around both sides of her back towards the flanks and abdomen although she does not have any specific abdominal pain.  She reports nausea but no vomiting.  She denies constipation or diarrhea and states she had a bowel movement yesterday.  She has no urinary symptoms, no vaginal bleeding or discharge.  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 Authorizing Provider  cyclobenzaprine (FLEXERIL) 5 MG tablet Take 1 tablet (5 mg total) by mouth at bedtime. 12/04/18   [place], [place], PA-C  oxyCODONE-acetaminophen (PERCOCET) 5-325 MG tablet Take 1 tablet by mouth every 6 (six) hours as needed for up to 5 days for severe pain. 12/04/18 12/09/18  Arta Silence, MD  predniSONE (DELTASONE) 20 MG tablet Take 1 tablet (20 mg total) by mouth 2 (two) times daily with a  meal for 5 days. 12/04/18 12/09/18  Lestine Box, PA-C    Allergies Patient has no known allergies.  Family History  Problem Relation Age of Onset   Hyperlipidemia Mother    Thyroid disease Mother    Anxiety disorder Mother    Pulmonary fibrosis Father    Thyroid disease Maternal Aunt    Anxiety disorder Maternal Aunt    Heart disease Maternal Grandmother    Hyperlipidemia Maternal Grandmother    Heart disease Paternal Grandmother    Hyperlipidemia Paternal Grandmother    Other Son        congenital diaphragmatic hernia    Social History Social History   Tobacco Use   Smoking status: Never Smoker   Smokeless tobacco: Never Used  Substance Use Topics   Alcohol use: No   Drug use: No    Review of Systems  Constitutional: No fever. Eyes: No redness. ENT: No sore throat. Cardiovascular: Denies chest pain. Respiratory: Denies shortness of breath. Gastrointestinal: Positive for nausea.  No vomiting or diarrhea. Genitourinary: Negative for dysuria.  No vaginal bleeding or discharge. Musculoskeletal: Positive for back pain. Skin: Negative for rash. Neurological: Negative for focal weakness or numbness.   ____________________________________________   PHYSICAL EXAM:  VITAL SIGNS: ED Triage Vitals  Enc Vitals Group     BP 12/04/18 1951 134/84     Pulse Rate 12/04/18 1951 90     Resp 12/04/18 1951 18     Temp 12/04/18 1951 98.2 F (36.8 C)  Temp Source 12/04/18 1951 Oral     SpO2 12/04/18 1951 100 %     Weight --      Height --      Head Circumference --      Peak Flow --      Pain Score 12/04/18 2126 9     Pain Loc --      Pain Edu? --      Excl. in [place]? --     Constitutional: Alert and oriented.  Uncomfortable appearing but in no acute distress. Eyes: Conjunctivae are normal.  No scleral icterus. Head: Atraumatic. Nose: No congestion/rhinnorhea. Mouth/Throat: Mucous membranes are moist.   Neck: Normal range of motion.    Cardiovascular: Good peripheral circulation. Respiratory: Normal respiratory effort.  No retractions.  Gastrointestinal: Soft and nontender. No distention.  Genitourinary: No CVA tenderness. Musculoskeletal: No midline spinal tenderness.  No significant thoracic or lumbar paraspinal muscle tenderness. Neurologic:  Normal speech and language.  5/5 motor strength and sensation to bilateral lower extremities Skin:  Skin is warm and dry. No rash noted. Psychiatric: Mood and affect are normal. Speech and behavior are normal.  ____________________________________________   LABS (all labs ordered are listed, but only abnormal results are displayed)  Labs Reviewed  COMPREHENSIVE METABOLIC PANEL - Abnormal; Notable for the following components:      Result Value   Potassium 3.4 (*)    Glucose, Bld 133 (*)    All other components within normal limits  URINALYSIS, COMPLETE (UACMP) WITH MICROSCOPIC - Abnormal; Notable for the following components:   Color, Urine STRAW (*)    APPearance CLEAR (*)    All other components within normal limits  LIPASE, BLOOD  CBC  CK  POC URINE PREG, ED  POCT PREGNANCY, URINE   ____________________________________________  EKG  ____________________________________________  RADIOLOGY  CT abdomen: Possible mild colitis.  Normal appendix.  No other acute abnormalities.  ____________________________________________   PROCEDURES  Procedure(s) performed: No  Procedures  Critical Care performed: No ____________________________________________   INITIAL IMPRESSION / ASSESSMENT AND PLAN / ED COURSE  Pertinent labs & imaging results that were available during my care of the patient were reviewed by me and considered in my medical decision making (see chart for details).  33 year old female with no significant PMH presents with bilateral mid to lower back pain over the last several days, gradual onset, not associated with specific trauma.  Over the  last day it started radiating around to the flanks and abdomen, however she denies any specific lower abdominal pain.  She had nausea but no vomiting, no other significant associated symptoms.  I reviewed the past medical records in [place].  The patient has had no recent prior ED visits or admissions.  She was seen in a virtual visit at urgent care earlier today and diagnosed with likely muscle spasm or other musculoskeletal mid back pain.  She was prescribed Flexeril and prednisone.  However, the pain subsequently worsened.  On exam the patient initially appeared uncomfortable but was not ill-appearing.  Her vital signs are normal.  The physical exam is unremarkable.  The patient has no abdominal tenderness, no midline or paraspinal tenderness in the back, no rashes, and no focal neurologic findings.  Overall presentation is still most consistent with musculoskeletal back pain.  Given the patient's exercise prior to this starting and the bilateral nature of the pain there is some possibility of rhabdomyolysis although I think that this is less likely.  The patient has no neurologic symptoms.  Given the lower thoracic/upper lumbar location of the back pain, and the fact that she has no lower abdominal pain, vaginal bleeding, or discharge there is no evidence of ovarian torsion, cyst rupture, or other gynecologic etiology.  There is no indication for pelvic exam at this time.  Differential also includes ureteral stone, or it could potentially be radiating pain from another intra-abdominal cause although the patient has no GI symptoms to suggest this.  Given the patient's age and lack of comorbidities, as well as the bilateral pain, there is no evidence of vascular etiology.  We will obtain labs, CT abdomen without IV contrast to look for ureteral stone or other acute intra-abdominal cause, and reassess.  ----------------------------------------- 11:46 PM on  12/04/2018 -----------------------------------------  The patient is feeling much better after pain medication and fluids.  She appears comfortable.  The lab work-up is within normal limits.  UA shows no acute findings.  The CK is normal.  The CT shows under distention of the colon suggestive of possible mild colitis and a punctate renal calculus but no other acute abnormalities.  It is possible that the patient has radiating pain from mild colitis, however she has no fever, elevated WBC count, diarrhea, or other clinical symptoms to suggest colitis; there is no indication for antibiotics or specific treatment.  There is no free fluid in the pelvis or abdomen, adnexal swelling, or any evidence of gynecologic etiology.  Overall given the reassuring labs and CT as well as the patient's stable vital signs and overall well appearance, there is no evidence of concerning acute etiology of the pain.  I suspect musculoskeletal etiology.  The patient states she feels comfortable going home.  I counseled her on the results of the work-up.  I will prescribe some additional pain medication and instructed her to continue the prednisone that was prescribed earlier.  I gave her very thorough return precautions and she expressed understanding.  ____________________________________________   FINAL CLINICAL IMPRESSION(S) / ED DIAGNOSES  Final diagnoses:  Acute bilateral low back pain without sciatica      NEW MEDICATIONS STARTED DURING THIS VISIT:  New Prescriptions   OXYCODONE-ACETAMINOPHEN (PERCOCET) 5-325 MG TABLET    Take 1 tablet by mouth every 6 (six) hours as needed for up to 5 days for severe pain.     Note:  This document was prepared using Dragon voice recognition software and may include unintentional dictation errors.    Arta Silence, MD 12/04/18 [phone]

## 2018-12-04 NOTE — ED Triage Notes (Signed)
Pt c/o bilateral flank pain that has worsened over last 24 hours. Pt denies urinary symptoms. Pt is nauseous in triage, guarding area.

## 2018-12-04 NOTE — Discharge Instructions (Signed)
Your CT scan shows possible mild inflammation in your large intestine.  Your pain also could be more muscular pain from the lower back.  You should continue the prednisone that you are prescribed at the urgent care and you can take the pain medication prescribed here as needed.  Return to the ER immediately if you have new, worsening, or persistent severe pain, fever, vomiting or diarrhea, blood in the stool, weakness, or any other new or worsening symptoms that concern you.

## 2018-12-04 NOTE — ED Provider Notes (Signed)
[place] Virtual Visit via Video Note:  Kathy Knight  initiated request for Telemedicine visit with [facility] team. I connected with Kathy Knight  on 12/04/2018 at 3:12 PM  for a synchronized telemedicine visit using a video enabled HIPPA compliant telemedicine application. I verified that I am speaking with Kathy Knight  using two identifiers. Lestine Box, PA-C  was physically located in a [facility] Urgent care site and RYLAN KAUFMANN was located at a different location.   The limitations of evaluation and management by telemedicine as well as the availability of in-person appointments were discussed. Patient was informed that she  may incur a bill ( including co-pay) for this virtual visit encounter. Kathy Knight  expressed understanding and gave verbal consent to proceed with virtual visit.   [phone] 12/04/18 Arrival Time: [id number]  CC: Back pain  SUBJECTIVE: History from: patient. Kathy Knight is a 33 y.o. female complains of upper to mid back pain that began 3 days ago.  Denies a precipitating event or specific injury, but does admit to a falling down a few stairs and landing on right hip.  Also reports working out 4 days ago.  Localizes the pain to the upper to mid back, no neck pain.  Describes the pain as constant and throbbing in character.  Has tried alternating ibuprofen and tylenol with minimal relief.  Symptoms are made worse at night.  Denies similar symptoms in the past.  Denies fever, chills, rash, chest pain, SOB, weakness, numbness and tingling, saddle paresthesias, loss of bowel or bladder function, blood in urine.      ROS: As per HPI.  All other pertinent ROS negative.     Past Medical History:  Diagnosis Date  . Mitral valve prolapse   . PVC (premature ventricular contraction)    Past Surgical History:  Procedure Laterality Date  . AUGMENTATION MAMMAPLASTY    . TONSILLECTOMY     No Known Allergies No current facility-administered  medications on file prior to encounter.    No current outpatient medications on file prior to encounter.    OBJECTIVE:  There were no vitals filed for this visit.  General appearance: alert; no distress Eyes: EOMI grossly HENT: normocephalic; atraumatic Neck: supple with FROM Lungs: normal respiratory effort; speaking in full sentences without difficulty Extremities: moves extremities without difficulty Skin: No obvious rashes Neurologic: No facial asymmetries Psychological: alert and cooperative; normal mood and affect   ASSESSMENT & PLAN:  1. Acute bilateral thoracic back pain   2. Back muscle spasm     Meds ordered this encounter  Medications  . predniSONE (DELTASONE) 20 MG tablet    Sig: Take 1 tablet (20 mg total) by mouth 2 (two) times daily with a meal for 5 days.    Dispense:  10 tablet    Refill:  0    Order Specific Question:   Supervising Provider    Answer:   Raylene Everts [[id number]]  . cyclobenzaprine (FLEXERIL) 5 MG tablet    Sig: Take 1 tablet (5 mg total) by mouth at bedtime.    Dispense:  12 tablet    Refill:  0    Order Specific Question:   Supervising Provider    Answer:   Raylene Everts [[id number]]    Continue conservative management of rest, ice, heat, and gentle stretches Take prednisone as needed for pain relief (may cause abdominal discomfort, ulcers, and GI bleeds avoid taking with other NSAIDs)  Take cyclobenzaprine at nighttime for symptomatic relief. Avoid driving or operating heavy machinery while using medication. Follow up in person or with PCP if symptoms persist Follow up in person or go to the ER if you have any new or worsening symptoms (fever, chills, chest pain, abdominal pain, changes in bowel or bladder habits, pain radiating into lower legs, etc...)   I discussed the assessment and treatment plan with the patient. The patient was provided an opportunity to ask questions and all were answered. The patient agreed with the plan  and demonstrated an understanding of the instructions.   The patient was advised to call back or seek an in-person evaluation if the symptoms worsen or if the condition fails to improve as anticipated.  I provided 15 minutes of non-face-to-face time during this encounter.  [place], PA-C  12/04/2018 3:12 PM     Stacey Drain [place], PA-C 12/04/18 1515

## 2018-12-04 NOTE — Discharge Instructions (Signed)
Continue conservative management of rest, ice, and gentle stretches Take prednisone as needed for pain relief (may cause abdominal discomfort, ulcers, and GI bleeds avoid taking with other NSAIDs) Take cyclobenzaprine at nighttime for symptomatic relief. Avoid driving or operating heavy machinery while using medication. Follow up in person or with PCP if symptoms persist Follow up in person or go to the ER if you have any new or worsening symptoms (fever, chills, chest pain, abdominal pain, changes in bowel or bladder habits, pain radiating into lower legs, etc...)

## 2019-04-12 DIAGNOSIS — Z20828 Contact with and (suspected) exposure to other viral communicable diseases: Secondary | ICD-10-CM | POA: Diagnosis not present

## 2019-08-20 DIAGNOSIS — H5213 Myopia, bilateral: Secondary | ICD-10-CM | POA: Diagnosis not present

## 2019-10-19 NOTE — Progress Notes (Signed)
Gynecology Annual Exam  PCP: Patient, No Pcp Per  Chief Complaint:  Chief Complaint  Patient presents with  . Gynecologic Exam    no concerns    History of Present Illness:Kathy Knight is a 34 year old Caucasian/White female, G2 P2002, who presents for her annual exam. She is having no significant GYN problems.  Her menses are absent due to IUD.  She denies spotting  The patient's past medical history is detailed in the past medical history section.  Since her last annual GYN exam dated 09/24/2017, she has had no significant changes in her health. Her son [name] has recovered from his surgery for a CHD and has not been on O2 for over a year.  She is sexually active. She is currently using an IUD for contraception. Mirena IUD was placed on 11/30/2016  Her most recent pap smear was obtained 09/24/2017 and was NIL Mammogram is not applicable.  There is no family history of breast cancer.  There is no family history of ovarian cancer.  The patient does do monthly self breast exams.  The patient does not smoke.  The patient rarely drinks alcohol  The patient does not use illegal drugs.  The patient exercises regularly.  The patient does get adequate calcium in her diet. She is also drinking Shakeology shakes She had a cholesterol screen in 2019 that was normal  Review of Systems: Review of Systems  Constitutional: Negative for chills, fever and weight loss.  HENT: Negative for congestion, sinus pain and sore throat.   Eyes: Negative for blurred vision and pain.  Respiratory: Negative for hemoptysis, shortness of breath and wheezing.   Cardiovascular: Negative for chest pain, palpitations and leg swelling.  Gastrointestinal: Negative for abdominal pain, blood in stool, diarrhea, heartburn, nausea and vomiting.  Genitourinary: Negative for dysuria, frequency, hematuria and urgency.       Positive for amenorrhea  Musculoskeletal: Negative for back pain, joint pain and myalgias.    Skin: Negative for itching and rash.  Neurological: Negative for dizziness, tingling and headaches.  Endo/Heme/Allergies: Negative for environmental allergies and polydipsia. Does not bruise/bleed easily.       Negative for hirsutism   Psychiatric/Behavioral: Negative for depression. The patient is not nervous/anxious and does not have insomnia.     Past Medical History:  Past Medical History:  Diagnosis Date  . Mitral valve prolapse   . PVC (premature ventricular contraction)     Past Surgical History:  Past Surgical History:  Procedure Laterality Date  . AUGMENTATION MAMMAPLASTY    . TONSILLECTOMY      Family History:  Family History  Problem Relation Age of Onset  . Hyperlipidemia Mother   . Thyroid disease Mother   . Anxiety disorder Mother   . Pulmonary fibrosis Father   . Thyroid disease Maternal Aunt   . Anxiety disorder Maternal Aunt   . Heart disease Maternal Grandmother   . Hyperlipidemia Maternal Grandmother   . Heart disease Paternal Grandmother   . Hyperlipidemia Paternal Grandmother   . Other Son        congenital diaphragmatic hernia    Social History:  Social History   Socioeconomic History  . Marital status: Married    Spouse name: Marylyn Ishihara  . Number of children: 2  . Years of education: Not on file  . Highest education level: Not on file  Occupational History  . Not on file  Tobacco Use  . Smoking status: Never Smoker  .  Smokeless tobacco: Never Used  Vaping Use  . Vaping Use: Never used  Substance and Sexual Activity  . Alcohol use: No  . Drug use: No  . Sexual activity: Yes    Birth control/protection: I.U.D.    Comment: Mirena  Other Topics Concern  . Not on file  Social History Narrative  . Not on file   Social Determinants of Health   Financial Resource Strain:   . Difficulty of Paying Living Expenses:   Food Insecurity:   . Worried About Charity fundraiser in the Last Year:   . Arboriculturist in the Last Year:    Transportation Needs:   . Film/video editor (Medical):   Marland Kitchen Lack of Transportation (Non-Medical):   Physical Activity:   . Days of Exercise per Week:   . Minutes of Exercise per Session:   Stress:   . Feeling of Stress :   Social Connections:   . Frequency of Communication with Friends and Family:   . Frequency of Social Gatherings with Friends and Family:   . Attends Religious Services:   . Active Member of Clubs or Organizations:   . Attends Archivist Meetings:   Marland Kitchen Marital Status:   Intimate Partner Violence:   . Fear of Current or Ex-Partner:   . Emotionally Abused:   Marland Kitchen Physically Abused:   . Sexually Abused:     Allergies:  No Known Allergies  Medications:  Current Outpatient Medications on File Prior to Visit  Medication Sig Dispense Refill  . levonorgestrel (MIRENA) 20 MCG/24HR IUD 1 each by Intrauterine route once.     No current facility-administered medications on file prior to visit.  Physical Exam Vitals: BP 98/60   Ht 5\' 3"  (1.6 m)   Wt 126 lb (57.2 kg)   Breastfeeding No   BMI 22.32 kg/m   General: WF in NAD HEENT: normocephalic, anicteric Neck: no thyroid enlargement, no palpable nodules, no cervical lymphadenopathy  Pulmonary: No increased work of breathing, CTAB Cardiovascular: RRR, without murmur  Breasts: s/p breast augmentation,  no tenderness, no palpable nodules or masses, no skin or nipple retraction present, no nipple discharge.  No axillary, infraclavicular or supraclavicular lymphadenopathy. Abdomen: Soft, non-tender, non-distended.  Umbilicus without lesions.  No hepatomegaly or masses palpable. No evidence of hernia. Genitourinary:  External: Normal external female genitalia.  Normal urethral meatus, normal  Bartholin's and Skene's glands.    Vagina: Normal vaginal mucosa, no evidence of prolapse.    Cervix: Grossly normal in appearance, no bleeding, deviated to the left,  non-tender, IUD strings not visible or  palpable  Uterus: Anteverted, deviated to the left, normal size, shape, and consistency, mobile, and non-tender  Adnexa: No adnexal masses, non-tender  Rectal: deferred  Lymphatic: no evidence of inguinal lymphadenopathy Extremities: no edema, erythema, or tenderness Neurologic: Grossly intact Psychiatric: mood appropriate, affect full     Assessment: 34 y.o. G2P2002 normal annual exam Missing IUD strings  Plan:   1) Breast cancer screening - recommend monthly self breast exam.   2) Cervical cancer screening - Pap was done. ASCCP guidelines and rational discussed.  Patient opts for yearly screening interval  3) Contraception - Mirena IUD-will expire in 2024  4) Routine healthcare maintenance including cholesterol and diabetes screening UTD.  5) RTO in 1 week for ultrasound for missing IUD strings and follow up  Dalia Heading, CNM

## 2019-10-20 ENCOUNTER — Other Ambulatory Visit: Payer: Self-pay

## 2019-10-20 ENCOUNTER — Other Ambulatory Visit (HOSPITAL_COMMUNITY)
Admission: RE | Admit: 2019-10-20 | Discharge: 2019-10-20 | Disposition: A | Discharge status: Home / Self Care | Payer: 59 | Source: Ambulatory Visit | Attending: Certified Nurse Midwife | Admitting: Certified Nurse Midwife

## 2019-10-20 ENCOUNTER — Ambulatory Visit (INDEPENDENT_AMBULATORY_CARE_PROVIDER_SITE_OTHER): Payer: 59 | Admitting: Certified Nurse Midwife

## 2019-10-20 ENCOUNTER — Encounter: Payer: Self-pay | Admitting: Certified Nurse Midwife

## 2019-10-20 VITALS — BP 98/60 | Ht 63.0 in | Wt 126.0 lb

## 2019-10-20 DIAGNOSIS — T8332XA Displacement of intrauterine contraceptive device, initial encounter: Secondary | ICD-10-CM

## 2019-10-20 DIAGNOSIS — Z124 Encounter for screening for malignant neoplasm of cervix: Secondary | ICD-10-CM | POA: Insufficient documentation

## 2019-10-20 DIAGNOSIS — Z01419 Encounter for gynecological examination (general) (routine) without abnormal findings: Secondary | ICD-10-CM | POA: Diagnosis not present

## 2019-10-22 LAB — CYTOLOGY - PAP
Comment: NEGATIVE
Diagnosis: NEGATIVE
High risk HPV: NEGATIVE

## 2019-11-05 ENCOUNTER — Ambulatory Visit: Payer: 59 | Admitting: Certified Nurse Midwife

## 2019-11-05 ENCOUNTER — Ambulatory Visit: Payer: 59

## 2019-11-19 ENCOUNTER — Other Ambulatory Visit: Payer: Self-pay

## 2019-11-19 ENCOUNTER — Ambulatory Visit (INDEPENDENT_AMBULATORY_CARE_PROVIDER_SITE_OTHER): Payer: 59

## 2019-11-19 ENCOUNTER — Ambulatory Visit (INDEPENDENT_AMBULATORY_CARE_PROVIDER_SITE_OTHER): Payer: 59 | Admitting: Certified Nurse Midwife

## 2019-11-19 ENCOUNTER — Encounter: Payer: Self-pay | Admitting: Certified Nurse Midwife

## 2019-11-19 VITALS — BP 92/60 | HR 83 | Ht 63.0 in | Wt 127.0 lb

## 2019-11-19 DIAGNOSIS — T8332XD Displacement of intrauterine contraceptive device, subsequent encounter: Secondary | ICD-10-CM

## 2019-11-19 DIAGNOSIS — T8332XA Displacement of intrauterine contraceptive device, initial encounter: Secondary | ICD-10-CM

## 2019-11-19 DIAGNOSIS — Z975 Presence of (intrauterine) contraceptive device: Secondary | ICD-10-CM

## 2019-11-22 NOTE — Progress Notes (Signed)
  HPI:  34 year old G2 P2002 WF who had a recent annual exam at which time her IUD strings were not seen. She had an ultrasound today for IUD location   Ultrasound demonstrates that the IUD is in place and the EM stripe is 3.73mm.   PMHx: She  has a past medical history of Mitral valve prolapse and PVC (premature ventricular contraction). Also,  has a past surgical history that includes Augmentation mammaplasty and Tonsillectomy., family history includes Anxiety disorder in her maternal aunt and mother; Heart disease in her maternal grandmother and paternal grandmother; Hyperlipidemia in her maternal grandmother, mother, and paternal grandmother; Other in her son; Pulmonary fibrosis in her father; Thyroid disease in her maternal aunt and mother.,  reports that she has never smoked. She has never used smokeless tobacco. She reports that she does not drink alcohol and does not use drugs.  She has a current medication list which includes the following prescription(s): levonorgestrel. Also, has No Known Allergies.  ROS  Objective: BP (!) 92/60   Pulse 83   Ht 5\' 3"  (1.6 m)   Wt 127 lb (57.6 kg)   LMP  (LMP Unknown)   BMI 22.50 kg/m   Physical examination Constitutional NAD, Conversant     Extremities: Moves all appropriately.  Normal ROM for age.  Neuro: Grossly intact  Psych: Oriented to PPT.  Normal mood. Normal affect.   Assessment:  Missing IUD strings-IUD in place  RTO 1 year for annual.  Dalia Heading, CNM

## 2020-01-13 ENCOUNTER — Ambulatory Visit (INDEPENDENT_AMBULATORY_CARE_PROVIDER_SITE_OTHER): Payer: 59 | Admitting: Dermatology

## 2020-01-13 ENCOUNTER — Other Ambulatory Visit: Payer: Self-pay

## 2020-01-13 ENCOUNTER — Encounter: Payer: Self-pay | Admitting: Dermatology

## 2020-01-13 DIAGNOSIS — L700 Acne vulgaris: Secondary | ICD-10-CM

## 2020-01-13 DIAGNOSIS — L814 Other melanin hyperpigmentation: Secondary | ICD-10-CM

## 2020-01-13 DIAGNOSIS — Z1283 Encounter for screening for malignant neoplasm of skin: Secondary | ICD-10-CM

## 2020-01-13 DIAGNOSIS — D229 Melanocytic nevi, unspecified: Secondary | ICD-10-CM

## 2020-01-13 DIAGNOSIS — L821 Other seborrheic keratosis: Secondary | ICD-10-CM

## 2020-01-13 DIAGNOSIS — D1800 Hemangioma unspecified site: Secondary | ICD-10-CM

## 2020-01-13 DIAGNOSIS — L578 Other skin changes due to chronic exposure to nonionizing radiation: Secondary | ICD-10-CM | POA: Diagnosis not present

## 2020-01-13 MED ORDER — DAPSONE 7.5 % EX GEL: CUTANEOUS | 2 refills | Status: DC

## 2020-01-13 MED ORDER — TRETINOIN 0.025 % EX CREA: TOPICAL_CREAM | Freq: Every evening | CUTANEOUS | 2 refills | Status: DC

## 2020-01-13 NOTE — Patient Instructions (Addendum)
Melanoma ABCDEs  Melanoma is the most dangerous type of skin cancer, and is the leading cause of death from skin disease.  You are more likely to develop melanoma if you:  Have light-colored skin, light-colored eyes, or red or blond hair  Spend a lot of time in the sun  Tan regularly, either outdoors or in a tanning bed  Have had blistering sunburns, especially during childhood  Have a close family member who has had a melanoma  Have atypical moles or large birthmarks  Early detection of melanoma is key since treatment is typically straightforward and cure rates are extremely high if we catch it early.   The first sign of melanoma is often a change in a mole or a new dark spot.  The ABCDE system is a way of remembering the signs of melanoma.  A for asymmetry:  The two halves do not match. B for border:  The edges of the growth are irregular. C for color:  A mixture of colors are present instead of an even brown color. D for diameter:  Melanomas are usually (but not always) greater than 9mm - the size of a pencil eraser. E for evolution:  The spot keeps changing in size, shape, and color.  Please check your skin once per month between visits. You can use a small mirror in front and a large mirror behind you to keep an eye on the back side or your body.   If you see any new or changing lesions before your next follow-up, please call to schedule a visit.  Please continue daily skin protection including broad spectrum sunscreen SPF 30+ to sun-exposed areas, reapplying every 2 hours as needed when you're outdoors.   Recommend taking Heliocare sun protection supplement daily in sunny weather for additional sun protection. For maximum protection on the sunniest days, you can take up to 2 capsules of regular Heliocare OR take 1 capsule of Heliocare Ultra. For prolonged exposure (such as a full day in the sun), you can repeat your dose of the supplement 4 hours after your first dose. Heliocare  can be purchased at [facility] or at [email or web address].   Topical retinoid medications like tretinoin/Retin-A, adapalene/Differin, tazarotene/Fabior, and Epiduo/Epiduo Forte can cause dryness and irritation when first started. Only apply a pea-sized amount to the entire affected area. Avoid applying it around the eyes, edges of mouth and creases at the nose. If you experience irritation, use a good moisturizer first and/or apply the medicine less often. If you are doing well with the medicine, you can increase how often you use it until you are applying every night. Be careful with sun protection while using this medication as it can make you sensitive to the sun. This medicine should not be used by pregnant women.   Your medications have been sent to [facility] and will be mailed to you after you call them and confirm your information with them.   [facility] [phone] [place], [place], [place] [address]

## 2020-01-13 NOTE — Progress Notes (Signed)
New Patient Visit  Subjective  Kathy Knight is a 34 y.o. female who presents for the following: Initial Skin Cancer Screening (New patient. No personal history of skin cancer. ). Here for full body skin exam and skin cancer screening.  Family history of BCC in patient's mother and her uncle. There is a history of tanning bed use.    Patient advises she is not concerned about any spots in particular but that gyn recommends she have a spot at her back checked.   She reports a history of acne for years, worse since starting masking. It is most bothersome at the chin. She uses an OTC topical vitamin A.   The following portions of the chart were reviewed this encounter and updated as appropriate:  Tobacco  Allergies  Meds  Problems  Med Hx  Surg Hx  Fam Hx      Review of Systems:  No other skin or systemic complaints except as noted in HPI or Assessment and Plan.  Objective  Well appearing patient in no apparent distress; mood and affect are within normal limits.  A full examination was performed including scalp, head, eyes, ears, nose, lips, neck, chest, axillae, abdomen, back, buttocks, bilateral upper extremities, bilateral lower extremities, hands, feet, fingers, toes, fingernails, and toenails. All findings within normal limits unless otherwise noted below.  Objective  Face: 1+ open and closed comedones at face, scattered small inflammatory papules at chin  Objective  Right Shoulder - Posterior: 0.35cm slightly irregular medium brown macule  Images       Assessment & Plan  Acne vulgaris Face  Chronic, flared. Worse with mask. Patient has mirena IUD but did not have acne as bad when she had it previously.   Start tretinoin 0.025% cream at bedtime as tolerated.  Start Aczone 7.5% to face in the mornings.   Topical retinoid medications like tretinoin/Retin-A, adapalene/Differin, tazarotene/Fabior, and Epiduo/Epiduo Forte can cause dryness and irritation when  first started. Only apply a pea-sized amount to the entire affected area. Avoid applying it around the eyes, edges of mouth and creases at the nose. If you experience irritation, use a good moisturizer first and/or apply the medicine less often. If you are doing well with the medicine, you can increase how often you use it until you are applying every night. Be careful with sun protection while using this medication as it can make you sensitive to the sun. This medicine should not be used by pregnant women.    Dapsone (ACZONE) 7.5 % GEL - Face  tretinoin (RETIN-A) 0.025 % cream - Face  Lentigines Right Shoulder - Posterior  Benign-appearing.  Observation.  Call clinic for new or changing moles.  Recommend daily use of broad spectrum spf 30+ sunscreen to sun-exposed areas.     Lentigines - Scattered tan macules - Discussed due to sun exposure - Benign, observe - Call for any changes  Seborrheic Keratoses - Stuck-on, waxy, tan-brown papules and plaques  - Discussed benign etiology and prognosis. - Observe - Call for any changes  Melanocytic Nevi - Tan-brown and/or pink-flesh-colored symmetric macules and papules - Benign appearing on exam today - Observation - Call clinic for new or changing moles - Recommend daily use of broad spectrum spf 30+ sunscreen to sun-exposed areas.   Hemangiomas - Red papules - Discussed benign nature - Observe - Call for any changes  Actinic Damage - diffuse scaly erythematous macules with underlying dyspigmentation - Recommend daily broad spectrum sunscreen SPF 30+ to sun-exposed  areas, reapply every 2 hours as needed.  - Call for new or changing lesions.  Skin cancer screening performed today.   Return in about 3 months (around 04/13/2020) for acne follow up, recheck right shoulder .  Graciella Belton, RMA, am acting as scribe for Forest Gleason, MD .  Documentation: I have reviewed the above documentation for accuracy and completeness,  and I agree with the above.  Forest Gleason, MD

## 2020-01-22 ENCOUNTER — Encounter: Payer: Self-pay | Admitting: Dermatology

## 2020-01-25 ENCOUNTER — Other Ambulatory Visit: Payer: Self-pay

## 2020-01-25 MED ORDER — DAPSONE 7.5 % EX GEL: CUTANEOUS | 3 refills | Status: DC

## 2020-01-25 NOTE — Progress Notes (Signed)
See MyChart message

## 2020-04-06 ENCOUNTER — Other Ambulatory Visit: Payer: Self-pay | Admitting: Dermatology

## 2020-04-06 ENCOUNTER — Ambulatory Visit (INDEPENDENT_AMBULATORY_CARE_PROVIDER_SITE_OTHER): Payer: 59 | Admitting: Dermatology

## 2020-04-06 ENCOUNTER — Other Ambulatory Visit: Payer: Self-pay

## 2020-04-06 DIAGNOSIS — L700 Acne vulgaris: Secondary | ICD-10-CM

## 2020-04-06 DIAGNOSIS — L814 Other melanin hyperpigmentation: Secondary | ICD-10-CM

## 2020-04-06 MED ORDER — DOXYCYCLINE HYCLATE 20 MG PO TABS: 20.0000 mg | ORAL_TABLET | Freq: Two times a day (BID) | ORAL | 0 refills | Status: DC

## 2020-04-06 MED ORDER — CLINDAMYCIN PHOSPHATE 1 % EX SOLN: Freq: Two times a day (BID) | CUTANEOUS | 0 refills | Status: DC

## 2020-04-06 MED ORDER — DOXYCYCLINE HYCLATE 20 MG PO TABS: 20.0000 mg | ORAL_TABLET | Freq: Two times a day (BID) | ORAL | 1 refills | Status: DC

## 2020-04-06 MED ORDER — CLINDAMYCIN PHOSPHATE 1 % EX SOLN: Freq: Every day | CUTANEOUS | 2 refills | Status: DC

## 2020-04-06 NOTE — Progress Notes (Signed)
   Follow-Up Visit   Subjective  Kathy Knight is a 34 y.o. female who presents for the following: Acne (Patient here today for 3 month follow up on acne vulgaris on face.  At last visit on 9/22 she was prescribed Tretinoin 0.025 % cream and Aczone 7.5 % gel to use on face. Patient reports in the past 2 weeks acne has gotten much better but has concerns with scarring. ) and Follow-up (Also 3 month follow up on lentigines on right shoulder. Recommended at last visit to wear sun screen of spf 30 or greater. Patient denies any changes with area on right shoulder. ).  Patient states 3 - 4 weeks it got worst but it has gotten better.   The following portions of the chart were reviewed this encounter and updated as appropriate:  Tobacco  Allergies  Meds  Problems  Med Hx  Surg Hx  Fam Hx      Objective  Well appearing patient in no apparent distress; mood and affect are within normal limits.  A focused examination was performed including face, chest, back, and right shoulder . Relevant physical exam findings are noted in the Assessment and Plan.  Objective  Head - Anterior (Face): Back and chest - with rare and small papules   Face with few inflammatory papules with trace open comedones   Objective  Right Shoulder - Anterior: 0.4 cm brown macule   Assessment & Plan  Acne vulgaris Head - Anterior (Face)  Chronic condition with expected duration over one year. Condition is bothersome to patient. Currently flared.  Advised no evidence of scarring today - just post-inflammatory hyperpigmentation and post-inflammatory erythema.   Start Clindamycin 1 % solution use twice daily for flare up  Start Doxycycline 20 mg twice a day with food for 1 month   Continue tretinoin 0.025% cream nightly Continue aczone 7.5% gel each morning  Doxycycline should be taken with food to prevent nausea. Do not lay down for 30 minutes after taking. Be cautious with sun exposure and use good sun  protection while on this medication. Pregnant women should not take this medication.    Ordered Medications: clindamycin (CLEOCIN T) 1 % external solution doxycycline (PERIOSTAT) 20 MG tablet  Other Related Medications Dapsone (ACZONE) 7.5 % GEL tretinoin (RETIN-A) 0.025 % cream  Lentigines Right Shoulder - Anterior  Clear. Observe for recurrence. Call clinic for new or changing lesions.  Recommend regular skin exams, daily broad-spectrum spf 30+ sunscreen use, and photoprotection.      Return in about 3 months (around 07/05/2020) for Acne follow up .   I, Ruthell Rummage, CMA, scribed for Alfonso Patten, MD.  Documentation: I have reviewed the above documentation for accuracy and completeness, and I agree with the above.  Forest Gleason, MD

## 2020-04-14 ENCOUNTER — Encounter: Payer: Self-pay | Admitting: Dermatology

## 2020-07-07 ENCOUNTER — Other Ambulatory Visit: Payer: Self-pay

## 2020-07-07 ENCOUNTER — Ambulatory Visit (INDEPENDENT_AMBULATORY_CARE_PROVIDER_SITE_OTHER): Payer: 59 | Admitting: Dermatology

## 2020-07-07 ENCOUNTER — Other Ambulatory Visit: Payer: Self-pay | Admitting: Dermatology

## 2020-07-07 DIAGNOSIS — L700 Acne vulgaris: Secondary | ICD-10-CM | POA: Diagnosis not present

## 2020-07-07 MED ORDER — TRETINOIN 0.05 % EX CREA: TOPICAL_CREAM | Freq: Every day | CUTANEOUS | 2 refills | Status: DC

## 2020-07-07 NOTE — Patient Instructions (Addendum)
If you have any questions or concerns for your doctor, please call our main line at [phone] and press option 4 to reach your doctor's medical assistant. If no one answers, please leave a voicemail as directed and we will return your call as soon as possible. Messages left after 4 pm will be answered the following business day.   You may also send [place] a message via [place]. We typically respond to MyChart messages within 1-2 business days.  For prescription refills, please ask your pharmacy to contact our office. Our fax number is [phone].  If you have an urgent issue when the clinic is closed that cannot wait until the next business day, you can page your doctor at the number below.    Please note that while we do our best to be available for urgent issues outside of office hours, we are not available 24/7.   If you have an urgent issue and are unable to reach [place], you may choose to seek medical care at your doctor's office, retail clinic, urgent care center, or emergency room.  If you have a medical emergency, please immediately call 911 or go to the emergency department.  Pager Numbers  - Dr. Nehemiah Massed: [phone]  - Dr. Laurence Ferrari: [phone]  - Dr. Nicole Kindred: [phone]  In the event of inclement weather, please call our main line at [phone] for an update on the status of any delays or closures.  Dermatology Medication Tips: Please keep the boxes that topical medications come in in order to help keep track of the instructions about where and how to use these. Pharmacies typically print the medication instructions only on the boxes and not directly on the medication tubes.   If your medication is too expensive, please contact our office at [phone] option 4 or send [place] a message through [place].   We are unable to tell what your co-pay for medications will be in advance as this is different depending on your insurance coverage. However, we may be able to find a substitute  medication at lower cost or fill out paperwork to get insurance to cover a needed medication.   If a prior authorization is required to get your medication covered by your insurance company, please allow [place] 1-2 business days to complete this process.  Drug prices often vary depending on where the prescription is filled and some pharmacies may offer cheaper prices.  The website www.goodrx.com contains coupons for medications through different pharmacies. The prices here do not account for what the cost may be with help from insurance (it may be cheaper with your insurance), but the website can give you the price if you did not use any insurance.  - You can print the associated coupon and take it with your prescription to the pharmacy.  - You may also stop by our office during regular business hours and pick up a GoodRx coupon card.  - If you need your prescription sent electronically to a different pharmacy, notify our office through [facility] or by phone at [phone] option 4.   Topical retinoid medications like tretinoin/Retin-A, adapalene/Differin, tazarotene/Fabior, and Epiduo/Epiduo Forte can cause dryness and irritation when first started. Only apply a pea-sized amount to the entire affected area. Avoid applying it around the eyes, edges of mouth and creases at the nose. If you experience irritation, use a good moisturizer first and/or apply the medicine less often. If you are doing well with the medicine, you can increase how often you use it until  you are applying every night. Be careful with sun protection while using this medication as it can make you sensitive to the sun. This medicine should not be used by pregnant women.    Recommend taking Heliocare sun protection supplement daily in sunny weather for additional sun protection. For maximum protection on the sunniest days, you can take up to 2 capsules of regular Heliocare OR take 1 capsule of Heliocare Ultra. For prolonged  exposure (such as a full day in the sun), you can repeat your dose of the supplement 4 hours after your first dose. Heliocare can be purchased at [facility] or at [email or web address].

## 2020-07-07 NOTE — Progress Notes (Signed)
   Follow-Up Visit   Subjective  Kathy Knight is a 35 y.o. female who presents for the following: Follow-up (Patient here today for 3 month acne follow up. She is using dapsone 7.5% QAM and tretinoin 0.025% QHS. She also has doxycycline 20mg  to take twice daily with food for flares as well as clindamycin. Patient advises her acne has improved but has a break out sometimes).  The following portions of the chart were reviewed this encounter and updated as appropriate:   Tobacco  Allergies  Meds  Problems  Med Hx  Surg Hx  Fam Hx      Review of Systems:  No other skin or systemic complaints except as noted in HPI or Assessment and Plan.  Objective  Well appearing patient in no apparent distress; mood and affect are within normal limits.  A focused examination was performed including face. Relevant physical exam findings are noted in the Assessment and Plan.  Objective  face: Trace open comedones, inflammatory papule at left forehead Chest and back are clear   Assessment & Plan  Acne vulgaris face  Chronic condition with duration or expected duration over one year. Condition is bothersome to patient. Not currently at goal.  Continue dapsone 7.5% QAM Continue tretinoin increasing to 0.05% cream QHS  May continue clindamycin as needed for flares. If flares continue, consider adding back doxycycline 20mg  BID with food.   Topical retinoid medications like tretinoin/Retin-A, adapalene/Differin, tazarotene/Fabior, and Epiduo/Epiduo Forte can cause dryness and irritation when first started. Only apply a pea-sized amount to the entire affected area. Avoid applying it around the eyes, edges of mouth and creases at the nose. If you experience irritation, use a good moisturizer first and/or apply the medicine less often. If you are doing well with the medicine, you can increase how often you use it until you are applying every night. Be careful with sun protection while using this  medication as it can make you sensitive to the sun. This medicine should not be used by pregnant women.    Ordered Medications: tretinoin (RETIN-A) 0.05 % cream  Other Related Medications Dapsone (ACZONE) 7.5 % GEL clindamycin (CLEOCIN T) 1 % external solution  Return in about 6 months (around 01/07/2021) for TBSE.  Graciella Belton, RMA, am acting as scribe for Forest Gleason, MD .  Documentation: I have reviewed the above documentation for accuracy and completeness, and I agree with the above.  Forest Gleason, MD

## 2020-07-18 ENCOUNTER — Encounter: Payer: Self-pay | Admitting: Dermatology

## 2020-07-27 ENCOUNTER — Other Ambulatory Visit: Payer: Self-pay

## 2020-07-27 MED FILL — Tretinoin Cream 0.05%: CUTANEOUS | 20 days supply | Qty: 45 | Fill #0 | Status: AC

## 2020-10-20 ENCOUNTER — Encounter: Payer: Self-pay | Admitting: Obstetrics and Gynecology

## 2020-10-20 ENCOUNTER — Other Ambulatory Visit: Payer: Self-pay

## 2020-10-20 ENCOUNTER — Ambulatory Visit (INDEPENDENT_AMBULATORY_CARE_PROVIDER_SITE_OTHER): Payer: 59 | Admitting: Obstetrics and Gynecology

## 2020-10-20 VITALS — BP 108/67 | HR 95 | Ht 64.0 in | Wt 127.0 lb

## 2020-10-20 DIAGNOSIS — Z8349 Family history of other endocrine, nutritional and metabolic diseases: Secondary | ICD-10-CM

## 2020-10-20 DIAGNOSIS — E7800 Pure hypercholesterolemia, unspecified: Secondary | ICD-10-CM | POA: Diagnosis not present

## 2020-10-20 DIAGNOSIS — Z13220 Encounter for screening for lipoid disorders: Secondary | ICD-10-CM

## 2020-10-20 DIAGNOSIS — R5383 Other fatigue: Secondary | ICD-10-CM

## 2020-10-20 NOTE — Progress Notes (Signed)
Gynecology Annual Exam   PCP: Patient, No Pcp Per (Inactive)  Chief Complaint:  Chief Complaint  Patient presents with   Gynecologic Exam    Annual - no concerns. RM 3    History of Present Illness: Patient is a 35 y.o. [id number] presents for annual exam. The patient has no complaints today.   LMP: No LMP recorded. (Menstrual status: IUD). Amenorrhea secondary to Mirena IUD, placement 11/30/2016.  At time of last annual strings were not visualized but TVUS 11/19/2019 confirmed correct placement and positioning with the endometrial canal.  The patient is sexually active. She currently uses IUD for contraception. She denies dyspareunia.  There is no notable family history of breast or ovarian cancer in her family.  The patient wears seatbelts: yes.   The patient has regular exercise: not asked.    The patient denies current symptoms of depression.    The patient reports prior elevated cholesterol but follow up cholesterol several years ago with normalization of values.  Does report significant hypercholesterolemia in her mom but no formal diagnosis of familial hypercholesterolemia or strong family history of early heart disease.   Review of Systems: Review of Systems  Constitutional:  Negative for chills and fever.  HENT:  Negative for congestion.   Respiratory:  Negative for cough and shortness of breath.   Cardiovascular:  Negative for chest pain and palpitations.  Gastrointestinal:  Negative for abdominal pain, constipation, diarrhea, heartburn, nausea and vomiting.  Genitourinary:  Negative for dysuria, frequency and urgency.  Skin:  Negative for itching and rash.  Neurological:  Negative for dizziness and headaches.  Endo/Heme/Allergies:  Negative for polydipsia.  Psychiatric/Behavioral:  Negative for depression.    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 (Menstrual status: IUD). Contraception:11/30/2016 Mirena IUD Last Pap: Results were: 6/29/2021no abnormalities   Obstetric History: G2P2002  Family History:  Family History  Problem Relation Age of Onset   Hyperlipidemia Mother    Thyroid disease Mother    Anxiety disorder Mother    Pulmonary fibrosis Father    Thyroid disease Maternal Aunt    Anxiety disorder Maternal Aunt    Heart disease Maternal Grandmother    Hyperlipidemia Maternal Grandmother    Heart disease Paternal Grandmother    Hyperlipidemia Paternal Grandmother    Other Son        congenital diaphragmatic hernia    Social History:  Social History   Socioeconomic History   Marital status: Married    Spouse name: Marylyn Ishihara   Number of children: 2   Years of education: Not on file   Highest education level: Not on file  Occupational History   Not on file  Tobacco Use   Smoking status: Never   Smokeless tobacco: Never  Vaping Use   Vaping Use: Never used  Substance and Sexual Activity   Alcohol use: No   Drug use: No   Sexual activity: Yes    Birth control/protection: I.U.D.    Comment: Mirena  Other Topics Concern   Not on file  Social History Narrative   Not on file   Social Determinants of Health  Financial Resource Strain: Not on file  Food Insecurity: Not on file  Transportation Needs: Not on file  Physical Activity: Not on file  Stress: Not on file  Social Connections: Not on file  Intimate Partner Violence: Not on file    Allergies:  No Known Allergies  Medications: Prior to Admission medications   Medication Sig Start Date End Date Taking? Authorizing Provider  Dapsone (ACZONE) 7.5 % GEL Apply to entire face every morning. 01/13/20  Yes Moye, [place], MD   tretinoin (RETIN-A) 0.05 % cream APPLY TOPICALLY AT BEDTIME. 07/07/20 07/07/21 Yes Moye, [place], MD  clindamycin (CLEOCIN T) 1 % external solution APPLY TO THE AFFECTED AREA(S) IN THE MORNING AND AT BEDTIME 04/06/20 04/06/21  Moye, [place], MD  Dapsone 7.5 % GEL APPLY A THIN COAT TO THE FACE EVERY MORNING 01/25/20 01/24/21  Moye, [place], MD  doxycycline (PERIOSTAT) 20 MG tablet TAKE 1 TABLET BY MOUTH TWICE DAILY 04/06/20 04/06/21  Moye, [place], MD  levonorgestrel (MIRENA) 20 MCG/24HR IUD 1 each by Intrauterine route once. 11/30/16   [provider]    Physical Exam Vitals: Blood pressure 108/67, pulse 95, height 5\' 4"  (1.626 m), weight 127 lb (57.6 kg).  General: NAD HEENT: normocephalic, anicteric Thyroid: no enlargement, no palpable nodules Pulmonary: No increased work of breathing, CTAB Cardiovascular: RRR, distal pulses 2+ Breast: Breast symmetrical s/p prior breast augmentation, no tenderness, no palpable nodules or masses, no skin or nipple retraction present, no capsular contracture, no nipple discharge.  No axillary or supraclavicular lymphadenopathy. Abdomen: soft, non-tender, non-distended.  Umbilicus without lesions.  No hepatomegaly, splenomegaly or masses palpable. No evidence of hernia  Genitourinary:  External: Normal external female genitalia.  Normal urethral meatus, normal Bartholin's and Skene's glands.    Vagina: Normal vaginal mucosa, no evidence of prolapse.    Cervix: Grossly normal in appearance, no bleeding.  IUD string once again non-visualized  Uterus: Non-enlarged, mobile, normal contour.  No CMT  Adnexa: ovaries non-enlarged, no adnexal masses  Rectal: deferred  Lymphatic: no evidence of inguinal lymphadenopathy Extremities: no edema, erythema, or tenderness Neurologic: Grossly intact Psychiatric: mood appropriate, affect full  Female chaperone present for pelvic and breast  portions of the physical exam    Assessment: 35 y.o. G2P2002 routine  annual exam  Plan: Problem List Items Addressed This Visit   None Visit Diagnoses     Hypercholesterolemia    -  Primary   Relevant Orders   Lipid panel   Lipid screening       Relevant Orders   Lipid panel   Other fatigue       Relevant Orders   TSH   Family history of thyroid disorder       Relevant Orders   TSH       1) STI screening  was notoffered and therefore not obtained  2)  ASCCP guidelines and rational discussed.  Patient opts for every 3 years screening interval  3) Contraception - the patient is currently using  IUD.  She is happy with her current form of contraception and plans to continue - discussed current FDA indication for Mirena is 7 years of use   4) Routine healthcare maintenance including cholesterol, diabetes screening discussed - check lipid panel and TSH  5) Return in about 1 year (around 10/20/2021) for annual.   Malachy Mood, MD, [place], [facility] 10/20/2020, 2:51 PM

## 2020-10-21 LAB — LIPID PANEL
Chol/HDL Ratio: 2.8 ratio (ref 0.0–4.4)
Cholesterol, Total: 190 mg/dL (ref 100–199)
HDL: 68 mg/dL (ref >39)
LDL Chol Calc (NIH): 110 mg/dL — ABNORMAL HIGH (ref 0–99)
Triglycerides: 63 mg/dL (ref 0–149)
VLDL Cholesterol Cal: 12 mg/dL (ref 5–40)

## 2020-10-21 LAB — TSH: TSH: 1.36 u[IU]/mL (ref 0.450–4.500)

## 2020-11-23 DIAGNOSIS — H5213 Myopia, bilateral: Secondary | ICD-10-CM | POA: Diagnosis not present

## 2021-01-05 ENCOUNTER — Ambulatory Visit (INDEPENDENT_AMBULATORY_CARE_PROVIDER_SITE_OTHER): Payer: 59 | Admitting: Dermatology

## 2021-01-05 ENCOUNTER — Other Ambulatory Visit: Payer: Self-pay

## 2021-01-05 DIAGNOSIS — L821 Other seborrheic keratosis: Secondary | ICD-10-CM | POA: Diagnosis not present

## 2021-01-05 DIAGNOSIS — Z1283 Encounter for screening for malignant neoplasm of skin: Secondary | ICD-10-CM

## 2021-01-05 DIAGNOSIS — D229 Melanocytic nevi, unspecified: Secondary | ICD-10-CM | POA: Diagnosis not present

## 2021-01-05 DIAGNOSIS — L814 Other melanin hyperpigmentation: Secondary | ICD-10-CM

## 2021-01-05 DIAGNOSIS — D1800 Hemangioma unspecified site: Secondary | ICD-10-CM | POA: Diagnosis not present

## 2021-01-05 DIAGNOSIS — L578 Other skin changes due to chronic exposure to nonionizing radiation: Secondary | ICD-10-CM | POA: Diagnosis not present

## 2021-01-05 DIAGNOSIS — L700 Acne vulgaris: Secondary | ICD-10-CM | POA: Diagnosis not present

## 2021-01-05 MED ORDER — TRETINOIN 0.05 % EX CREA
TOPICAL_CREAM | CUTANEOUS | 2 refills | Status: AC
  Filled 2021-01-05: qty 45, 30d supply, fill #0

## 2021-01-05 MED ORDER — DAPSONE 7.5 % EX GEL
CUTANEOUS | 2 refills | Status: DC
  Filled 2021-01-05: qty 60, 30d supply, fill #0

## 2021-01-05 NOTE — Patient Instructions (Addendum)
If you have any questions or concerns for your doctor, please call our main line at [phone] and press option 4 to reach your doctor's medical assistant. If no one answers, please leave a voicemail as directed and we will return your call as soon as possible. Messages left after 4 pm will be answered the following business day.   You may also send us a message via MyChart. We typically respond to MyChart messages within 1-2 business days.  For prescription refills, please ask your pharmacy to contact our office. Our fax number is [phone].  If you have an urgent issue when the clinic is closed that cannot wait until the next business day, you can page your doctor at the number below.    Please note that while we do our best to be available for urgent issues outside of office hours, we are not available 24/7.   If you have an urgent issue and are unable to reach us, you may choose to seek medical care at your doctor's office, retail clinic, urgent care center, or emergency room.  If you have a medical emergency, please immediately call 911 or go to the emergency department.  Pager Numbers  - Dr. Kowalski: [phone]  - Dr. Moye: [phone]  - Dr. Stewart: [phone]  In the event of inclement weather, please call our main line at [phone] for an update on the status of any delays or closures.  Dermatology Medication Tips: Please keep the boxes that topical medications come in in order to help keep track of the instructions about where and how to use these. Pharmacies typically print the medication instructions only on the boxes and not directly on the medication tubes.   If your medication is too expensive, please contact our office at [phone] option 4 or send us a message through MyChart.   We are unable to tell what your co-pay for medications will be in advance as this is different depending on your insurance coverage. However, we may be able to find a substitute  medication at lower cost or fill out paperwork to get insurance to cover a needed medication.   If a prior authorization is required to get your medication covered by your insurance company, please allow us 1-2 business days to complete this process.  Drug prices often vary depending on where the prescription is filled and some pharmacies may offer cheaper prices.  The website www.goodrx.com contains coupons for medications through different pharmacies. The prices here do not account for what the cost may be with help from insurance (it may be cheaper with your insurance), but the website can give you the price if you did not use any insurance.  - You can print the associated coupon and take it with your prescription to the pharmacy.  - You may also stop by our office during regular business hours and pick up a GoodRx coupon card.  - If you need your prescription sent electronically to a different pharmacy, notify our office through [facility] MyChart or by phone at [phone] option 4.  Recommend taking Heliocare sun protection supplement daily in sunny weather for additional sun protection. For maximum protection on the sunniest days, you can take up to 2 capsules of regular Heliocare OR take 1 capsule of Heliocare Ultra. For prolonged exposure (such as a full day in the sun), you can repeat your dose of the supplement 4 hours after your first dose. Heliocare can be purchased at [facility] or at [email or web address].    

## 2021-01-05 NOTE — Progress Notes (Signed)
Follow-Up Visit   Subjective  Kathy Knight is a 35 y.o. female who presents for the following: Annual Exam (Hx acne - patient currently using Dapsone 7.5% gel QAM and Tretinoin 0.05% cream QHS. She does use Clindamycin solution occasionally PRN flares but has never had to use the Doxycycline. ).   The following portions of the chart were reviewed this encounter and updated as appropriate:   Tobacco  Allergies  Meds  Problems  Med Hx  Surg Hx  Fam Hx      Review of Systems:  No other skin or systemic complaints except as noted in HPI or Assessment and Plan.  Objective  Well appearing patient in no apparent distress; mood and affect are within normal limits.  A full examination was performed including scalp, head, eyes, ears, nose, lips, neck, chest, axillae, abdomen, back, buttocks, bilateral upper extremities, bilateral lower extremities, hands, feet, fingers, toes, fingernails, and toenails. All findings within normal limits unless otherwise noted below.  Face Rare open comedone.    Assessment & Plan  Acne vulgaris Face  Chronic condition with duration or expected duration over one year. Currently well-controlled.  Continue Tretinoin 0.05% cream QHS. Topical retinoid medications like tretinoin/Retin-A, adapalene/Differin, tazarotene/Fabior, and Epiduo/Epiduo Forte can cause dryness and irritation when first started. Only apply a pea-sized amount to the entire affected area. Avoid applying it around the eyes, edges of mouth and creases at the nose. If you experience irritation, use a good moisturizer first and/or apply the medicine less often. If you are doing well with the medicine, you can increase how often you use it until you are applying every night. Be careful with sun protection while using this medication as it can make you sensitive to the sun. This medicine should not be used by pregnant women.   Continue Dapsone 7.5% gel QAM.   Continue Clindamycin solution QD  PRN flares.   Related Medications clindamycin (CLEOCIN T) 1 % external solution APPLY TO THE AFFECTED AREA(S) IN THE MORNING AND AT BEDTIME  doxycycline (PERIOSTAT) 20 MG tablet TAKE 1 TABLET BY MOUTH TWICE DAILY  Dapsone (ACZONE) 7.5 % GEL Apply to entire face every morning.  tretinoin (RETIN-A) 0.05 % cream APPLY TOPICALLY AT BEDTIME.  Lentigines - Scattered tan macules - Due to sun exposure - Benign-appearing, observe - Recommend daily broad spectrum sunscreen SPF 30+ to sun-exposed areas, reapply every 2 hours as needed. - Call for any changes  Seborrheic Keratoses - Stuck-on, waxy, tan-brown papules and/or plaques  - Benign-appearing - Discussed benign etiology and prognosis. - Observe - Call for any changes  Melanocytic Nevi - Tan-brown and/or pink-flesh-colored symmetric macules and papules - Benign appearing on exam today - Observation - Call clinic for new or changing moles - Recommend daily use of broad spectrum spf 30+ sunscreen to sun-exposed areas.   Hemangiomas - Red papules - Discussed benign nature - Observe - Call for any changes  Actinic Damage - Chronic condition, secondary to cumulative UV/sun exposure - diffuse scaly erythematous macules with underlying dyspigmentation - Recommend daily broad spectrum sunscreen SPF 30+ to sun-exposed areas, reapply every 2 hours as needed.  - Staying in the shade or wearing long sleeves, sun glasses (UVA+UVB protection) and wide brim hats (4-inch brim around the entire circumference of the hat) are also recommended for sun protection.  - Call for new or changing lesions.  Skin cancer screening performed today.  Return in about 1 year (around 01/05/2022) for TBSE.  Luther Redo, CMA,  am acting as scribe for Forest Gleason, MD .  Documentation: I have reviewed the above documentation for accuracy and completeness, and I agree with the above.  Forest Gleason, MD

## 2021-01-16 ENCOUNTER — Encounter: Payer: Self-pay | Admitting: Dermatology

## 2021-06-13 ENCOUNTER — Other Ambulatory Visit: Payer: Self-pay

## 2021-06-19 ENCOUNTER — Other Ambulatory Visit: Payer: Self-pay

## 2021-06-19 ENCOUNTER — Telehealth: Payer: 59 | Admitting: Physician Assistant

## 2021-06-19 DIAGNOSIS — J019 Acute sinusitis, unspecified: Secondary | ICD-10-CM

## 2021-06-19 DIAGNOSIS — B9689 Other specified bacterial agents as the cause of diseases classified elsewhere: Secondary | ICD-10-CM

## 2021-06-19 MED ORDER — IPRATROPIUM BROMIDE 0.03 % NA SOLN
2.0000 | Freq: Two times a day (BID) | NASAL | 0 refills | Status: DC
  Filled 2021-06-19: qty 30, 30d supply, fill #0

## 2021-06-19 MED ORDER — AMOXICILLIN-POT CLAVULANATE 875-125 MG PO TABS
1.0000 | ORAL_TABLET | Freq: Two times a day (BID) | ORAL | 0 refills | Status: DC
  Filled 2021-06-19: qty 14, 7d supply, fill #0

## 2021-06-19 NOTE — Progress Notes (Signed)
E-Visit for Sinus Problems  We are sorry that you are not feeling well.  Here is how we plan to help!  Based on what you have shared with me it looks like you have sinusitis.  Sinusitis is inflammation and infection in the sinus cavities of the head.  Based on your presentation I believe you most likely have Acute Bacterial Sinusitis.  This is an infection caused by bacteria and is treated with antibiotics. I have prescribed Augmentin 875mg /125mg  one tablet twice daily with food, for 7 days. I have also prescribed Ipratropium Bromide nasal spray for the nasal congestion. You may use an oral decongestant such as Mucinex D or if you have glaucoma or high blood pressure use plain Mucinex. Saline nasal spray help and can safely be used as often as needed for congestion.  If you develop worsening sinus pain, fever or notice severe headache and vision changes, or if symptoms are not better after completion of antibiotic, please schedule an appointment with a health care provider.    Sinus infections are not as easily transmitted as other respiratory infection, however we still recommend that you avoid close contact with loved ones, especially the very young and elderly.  Remember to wash your hands thoroughly throughout the day as this is the number one way to prevent the spread of infection!  Home Care: Only take medications as instructed by your medical team. Complete the entire course of an antibiotic. Do not take these medications with alcohol. A steam or ultrasonic humidifier can help congestion.  You can place a towel over your head and breathe in the steam from hot water coming from a faucet. Avoid close contacts especially the very young and the elderly. Cover your mouth when you cough or sneeze. Always remember to wash your hands.  Get Help Right Away If: You develop worsening fever or sinus pain. You develop a severe head ache or visual changes. Your symptoms persist after you have completed  your treatment plan.  Make sure you Understand these instructions. Will watch your condition. Will get help right away if you are not doing well or get worse.  Thank you for choosing an e-visit.  Your e-visit answers were reviewed by a board certified advanced clinical practitioner to complete your personal care plan. Depending upon the condition, your plan could have included both over the counter or prescription medications.  Please review your pharmacy choice. Make sure the pharmacy is open so you can pick up prescription now. If there is a problem, you may contact your provider through [employer] and have the prescription routed to another pharmacy.  Your safety is important to [place]. If you have drug allergies check your prescription carefully.   For the next 24 hours you can use MyChart to ask questions about today's visit, request a non-urgent call back, or ask for a work or school excuse. You will get an email in the next two days asking about your experience. I hope that your e-visit has been valuable and will speed your recovery.  I provided 5 minutes of non face-to-face time during this encounter for chart review and documentation.

## 2021-07-15 IMAGING — CT CT RENAL STONE PROTOCOL
3 of 4 series · 9 of 46 positions shown, 16 images · non-contrast
Comparison: None.

CLINICAL DATA: 33-year-old female with bilateral flank pain.

EXAM:
CT ABDOMEN AND PELVIS WITHOUT CONTRAST
TECHNIQUE: Multidetector CT imaging of the abdomen and pelvis was performed
following the standard protocol without IV contrast.

[Series 4: lung bases · axial · 0.77mm/px · z∈[-642,-567]mm · 5 of 23 slices shown, 10 images]
[im 4/23  soft-tissue]
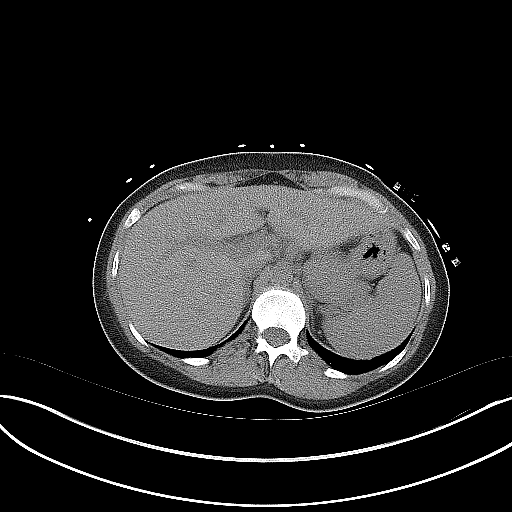
[im 4/23  bone]
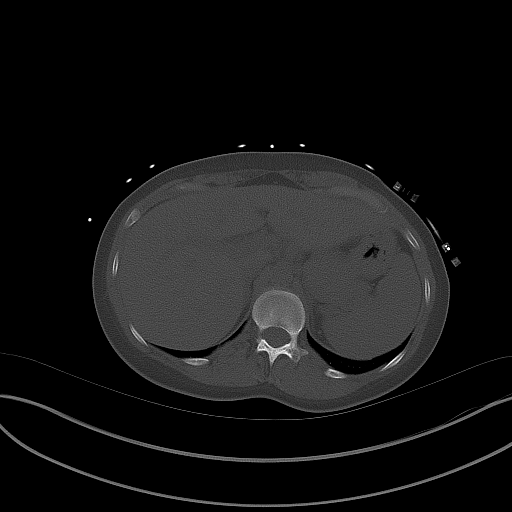
[im 8/23  soft-tissue]
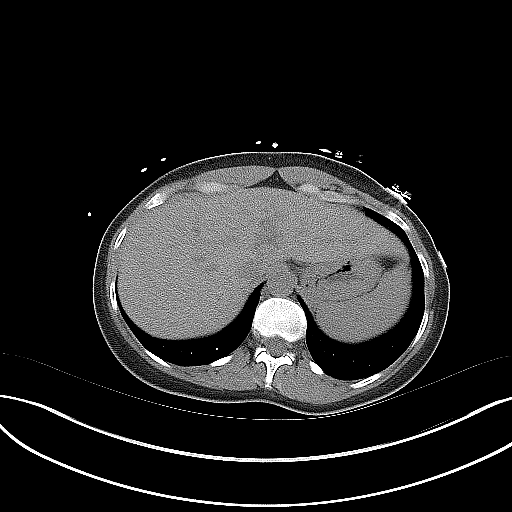
[im 8/23  lung]
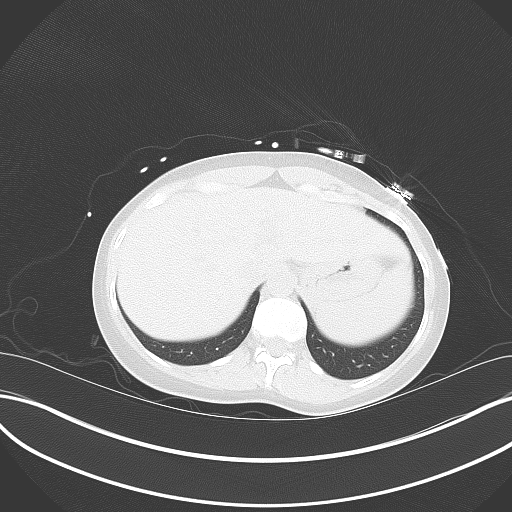
[im 12/23  soft-tissue]
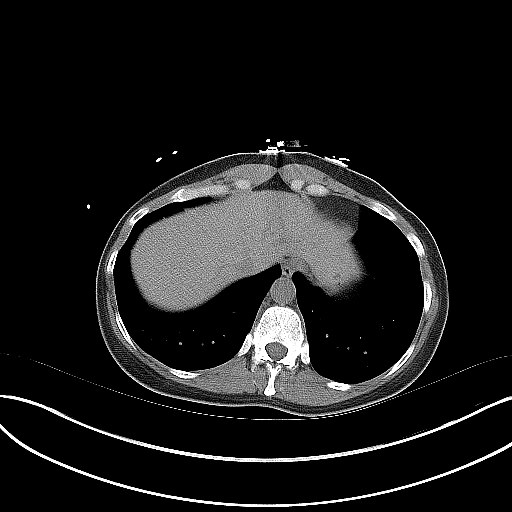
[im 12/23  lung]
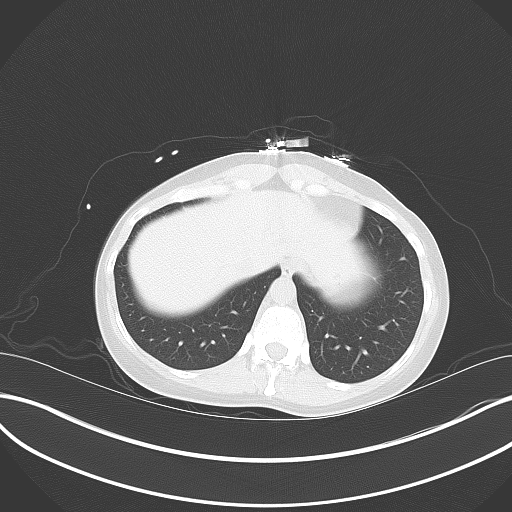
[im 15/23  soft-tissue]
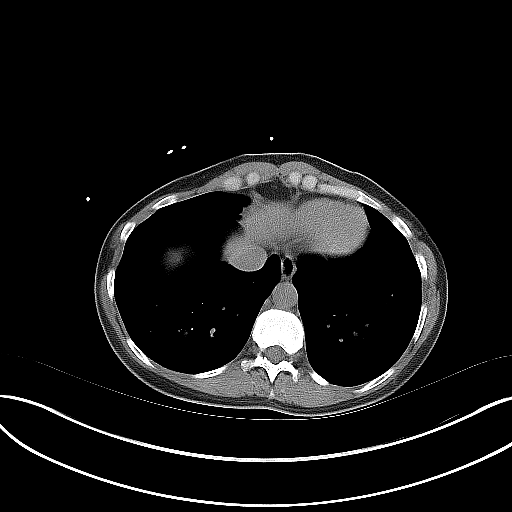
[im 15/23  lung]
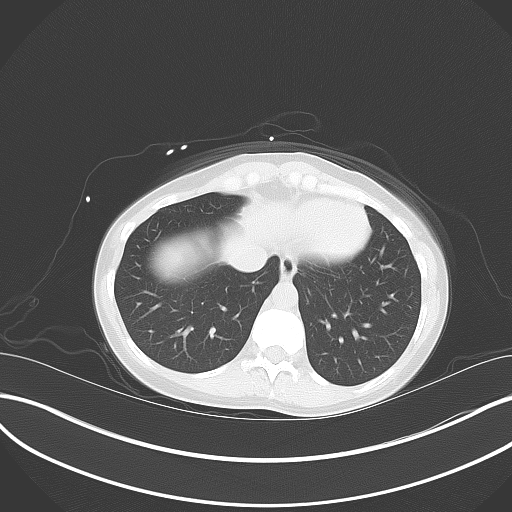
[im 19/23  soft-tissue]
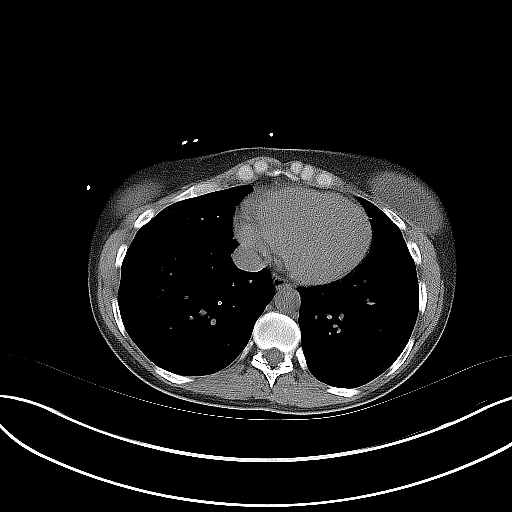
[im 19/23  lung]
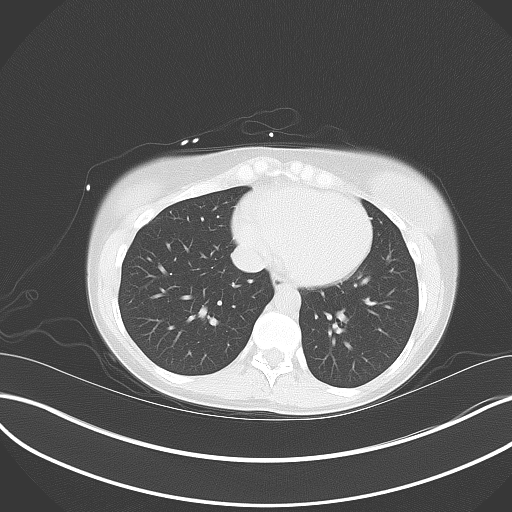

[Series 5: coronal · coronal · 0.67mm/px · 3 of 102 slices shown, 4 images]
[im 34/102  soft-tissue]
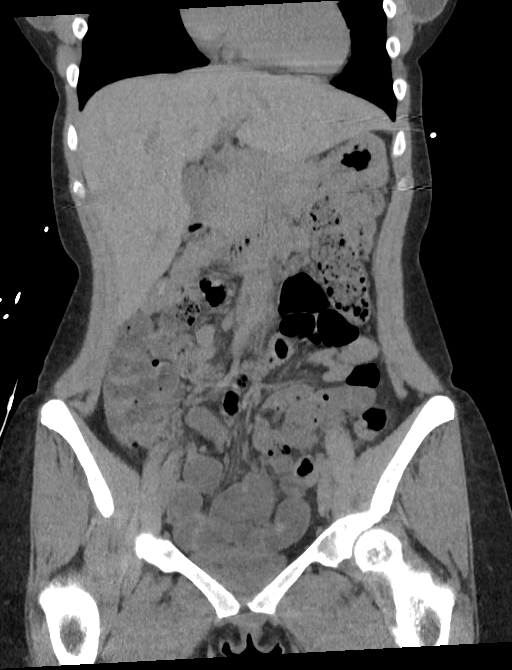
[im 45/102  soft-tissue]
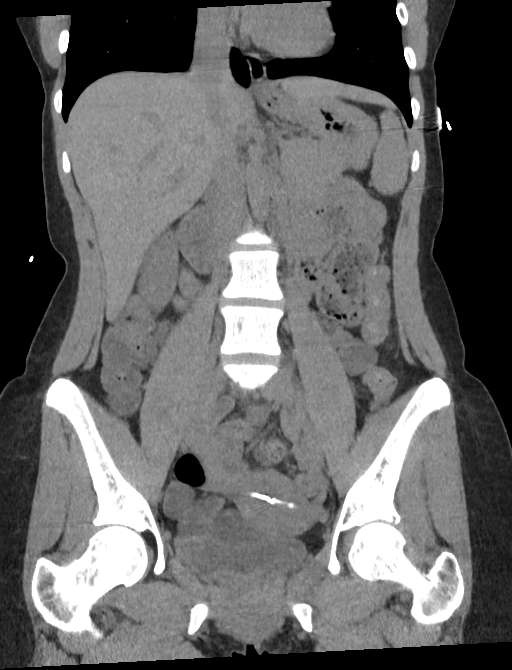
[im 45/102  bone]
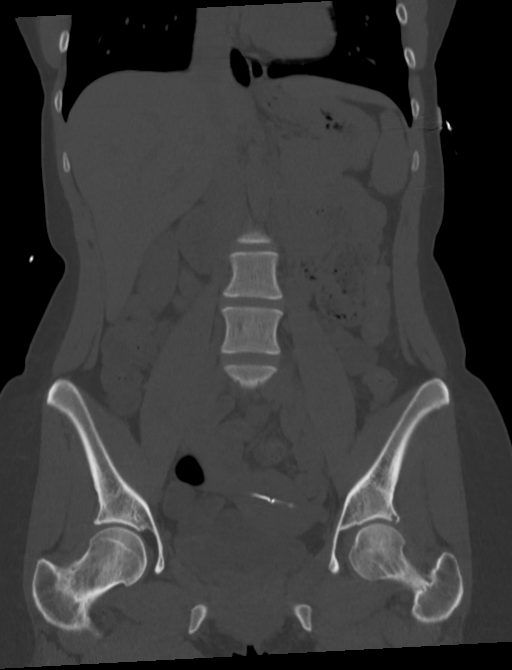
[im 57/102  soft-tissue]
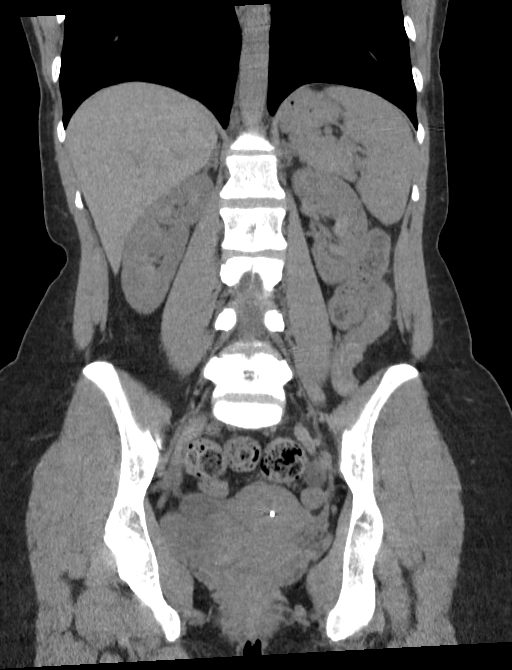

[Series 6: sagittal · sagittal · 0.47mm/px · 1 of 160 slices shown, 2 images]
[im 54/160  soft-tissue]
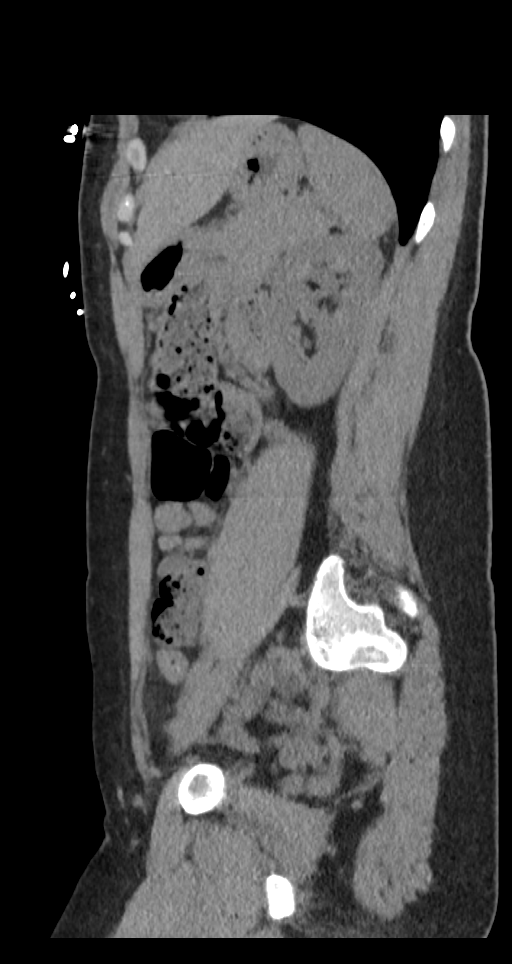
[im 54/160  bone]
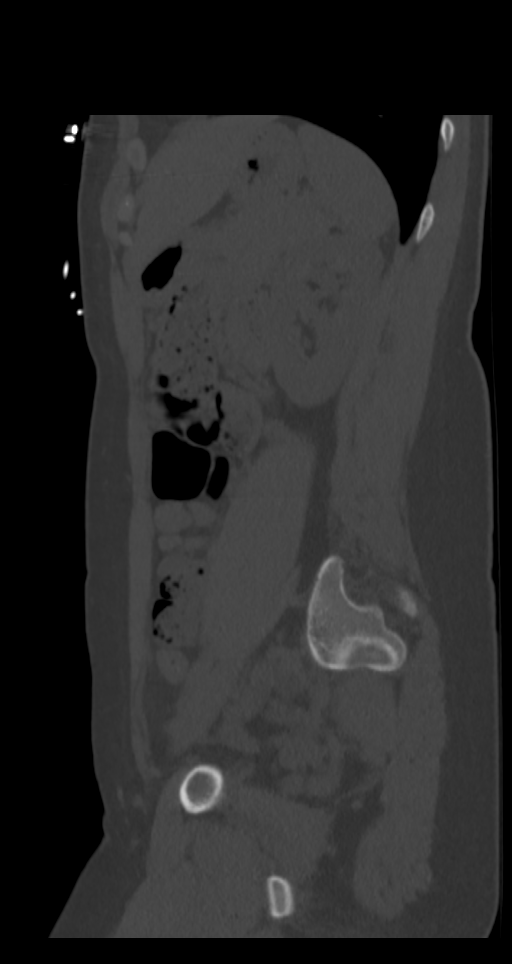

[9 of 46 positions shown; findings below may reference images not displayed]

FINDINGS: Evaluation of this exam is limited in the absence of intravenous
contrast.

Lower chest: The visualized lung bases are clear.

No intra-abdominal free air or free fluid.

Hepatobiliary: The liver is unremarkable. No intrahepatic biliary
ductal dilatation. No calcified gallstone or pericholecystic fluid.

Pancreas: Unremarkable. No pancreatic ductal dilatation or
surrounding inflammatory changes.

Spleen: Normal in size without focal abnormality.

Adrenals/Urinary Tract: The adrenal glands are unremarkable. There
is a punctate nonobstructing right renal inferior pole calculus.
There is no hydronephrosis. The left kidney is unremarkable. The
visualized ureters and urinary bladder appear unremarkable as well.

Stomach/Bowel: There is no bowel obstruction. Mild thickened
appearance of the colon, likely related to underdistention. Colitis
is less likely. Clinical correlation is recommended. The appendix is
normal.

Vascular/Lymphatic: The abdominal aorta and IVC are grossly
unremarkable on this noncontrast CT. No portal venous gas. There is
no adenopathy.

Reproductive: The uterus is anteverted and grossly unremarkable. An
intrauterine device is noted. No adnexal masses identified.

Other: Small fat containing umbilical hernia.

Musculoskeletal: No acute or significant osseous findings.
IMPRESSION: 1. Punctate nonobstructing right renal inferior pole calculus. No
hydronephrosis or obstructing stone.
2. Underdistention of the colon versus less likely mild colitis.
Clinical correlation is recommended. No bowel obstruction. Normal
appendix.

## 2021-08-23 ENCOUNTER — Other Ambulatory Visit: Payer: Self-pay

## 2021-08-23 DIAGNOSIS — J020 Streptococcal pharyngitis: Secondary | ICD-10-CM | POA: Diagnosis not present

## 2021-08-23 DIAGNOSIS — R070 Pain in throat: Secondary | ICD-10-CM | POA: Diagnosis not present

## 2021-08-23 MED ORDER — PENICILLIN V POTASSIUM 500 MG PO TABS
ORAL_TABLET | ORAL | 0 refills | Status: DC
  Filled 2021-08-23: qty 20, 10d supply, fill #0

## 2022-01-03 ENCOUNTER — Ambulatory Visit (INDEPENDENT_AMBULATORY_CARE_PROVIDER_SITE_OTHER): Payer: 59 | Admitting: Dermatology

## 2022-01-03 ENCOUNTER — Other Ambulatory Visit: Payer: Self-pay

## 2022-01-03 DIAGNOSIS — L821 Other seborrheic keratosis: Secondary | ICD-10-CM

## 2022-01-03 DIAGNOSIS — Z1283 Encounter for screening for malignant neoplasm of skin: Secondary | ICD-10-CM | POA: Diagnosis not present

## 2022-01-03 DIAGNOSIS — L814 Other melanin hyperpigmentation: Secondary | ICD-10-CM

## 2022-01-03 DIAGNOSIS — D229 Melanocytic nevi, unspecified: Secondary | ICD-10-CM | POA: Diagnosis not present

## 2022-01-03 DIAGNOSIS — L700 Acne vulgaris: Secondary | ICD-10-CM | POA: Diagnosis not present

## 2022-01-03 DIAGNOSIS — L578 Other skin changes due to chronic exposure to nonionizing radiation: Secondary | ICD-10-CM

## 2022-01-03 MED ORDER — TRETINOIN 0.05 % EX CREA
TOPICAL_CREAM | Freq: Every day | CUTANEOUS | 5 refills | Status: AC
  Filled 2022-01-03: qty 45, fill #0

## 2022-01-03 NOTE — Patient Instructions (Addendum)
Continue tretinoin 0.05% cream qhs as tolerated.   Topical retinoid medications like tretinoin/Retin-A, adapalene/Differin, tazarotene/Fabior, and Epiduo/Epiduo Forte can cause dryness and irritation when first started. Only apply a pea-sized amount to the entire affected area. Avoid applying it around the eyes, edges of mouth and creases at the nose. If you experience irritation, use a good moisturizer first and/or apply the medicine less often. If you are doing well with the medicine, you can increase how often you use it until you are applying every night. Be careful with sun protection while using this medication as it can make you sensitive to the sun. This medicine should not be used by pregnant women.   Recommend taking Heliocare sun protection supplement daily in sunny weather for additional sun protection. For maximum protection on the sunniest days, you can take up to 2 capsules of regular Heliocare OR take 1 capsule of Heliocare Ultra. For prolonged exposure (such as a full day in the sun), you can repeat your dose of the supplement 4 hours after your first dose. Heliocare can be purchased at [employer], at some [facility] or at [email or web address].    Melanoma ABCDEs  Melanoma is the most dangerous type of skin cancer, and is the leading cause of death from skin disease.  You are more likely to develop melanoma if you: Have light-colored skin, light-colored eyes, or red or blond hair Spend a lot of time in the sun Tan regularly, either outdoors or in a tanning bed Have had blistering sunburns, especially during childhood Have a close family member who has had a melanoma Have atypical moles or large birthmarks  Early detection of melanoma is key since treatment is typically straightforward and cure rates are extremely high if we catch it early.   The first sign of melanoma is often a change in a mole or a new dark spot.  The ABCDE system is a way of remembering the signs of  melanoma.  A for asymmetry:  The two halves do not match. B for border:  The edges of the growth are irregular. C for color:  A mixture of colors are present instead of an even brown color. D for diameter:  Melanomas are usually (but not always) greater than 30m - the size of a pencil eraser. E for evolution:  The spot keeps changing in size, shape, and color.  Please check your skin once per month between visits. You can use a small mirror in front and a large mirror behind you to keep an eye on the back side or your body.   If you see any new or changing lesions before your next follow-up, please call to schedule a visit.  Please continue daily skin protection including broad spectrum sunscreen SPF 30+ to sun-exposed areas, reapplying every 2 hours as needed when you're outdoors.    Due to recent changes in healthcare laws, you may see results of your pathology and/or laboratory studies on MyChart before the doctors have had a chance to review them. We understand that in some cases there may be results that are confusing or concerning to you. Please understand that not all results are received at the same time and often the doctors may need to interpret multiple results in order to provide you with the best plan of care or course of treatment. Therefore, we ask that you please give u[place]2 business days to thoroughly review all your results before contacting the office for clarification. Should we see a critical lab  result, you will be contacted sooner.   If You Need Anything After Your Visit  If you have any questions or concerns for your doctor, please call our main line at [phone] and press option 4 to reach your doctor's medical assistant. If no one answers, please leave a voicemail as directed and we will return your call as soon as possible. Messages left after 4 pm will be answered the following business day.   You may also send [place] a message via [place]. We typically respond to  MyChart messages within 1-2 business days.  For prescription refills, please ask your pharmacy to contact our office. Our fax number is [phone].  If you have an urgent issue when the clinic is closed that cannot wait until the next business day, you can page your doctor at the number below.    Please note that while we do our best to be available for urgent issues outside of office hours, we are not available 24/7.   If you have an urgent issue and are unable to reach [place], you may choose to seek medical care at your doctor's office, retail clinic, urgent care center, or emergency room.  If you have a medical emergency, please immediately call 911 or go to the emergency department.  Pager Numbers  - Dr. Nehemiah Massed: [phone]  - Dr. Laurence Ferrari: [phone]  - Dr. Nicole Kindred: [phone]  In the event of inclement weather, please call our main line at [phone] for an update on the status of any delays or closures.  Dermatology Medication Tips: Please keep the boxes that topical medications come in in order to help keep track of the instructions about where and how to use these. Pharmacies typically print the medication instructions only on the boxes and not directly on the medication tubes.   If your medication is too expensive, please contact our office at [phone] option 4 or send [place] a message through [place].   We are unable to tell what your co-pay for medications will be in advance as this is different depending on your insurance coverage. However, we may be able to find a substitute medication at lower cost or fill out paperwork to get insurance to cover a needed medication.   If a prior authorization is required to get your medication covered by your insurance company, please allow [place] 1-2 business days to complete this process.  Drug prices often vary depending on where the prescription is filled and some pharmacies may offer cheaper prices.  The website www.goodrx.com  contains coupons for medications through different pharmacies. The prices here do not account for what the cost may be with help from insurance (it may be cheaper with your insurance), but the website can give you the price if you did not use any insurance.  - You can print the associated coupon and take it with your prescription to the pharmacy.  - You may also stop by our office during regular business hours and pick up a GoodRx coupon card.  - If you need your prescription sent electronically to a different pharmacy, notify our office through [facility] or by phone at [phone] option 4.     Si Usted Necesita Algo Despus de Su Visita  Tambin puede enviarnos un mensaje a travs de Pharmacist, community. Por lo general respondemos a los mensajes de MyChart en el transcurso de 1 a 2 das hbiles.  Para renovar recetas, por favor pida a su farmacia que se ponga en contacto con nuestra oficina. Oswald Hillock  [facility] [phone].  Si tiene un asunto urgente cuando la clnica est cerrada y que no puede esperar hasta el siguiente da hbil, puede llamar/localizar a su doctor(a) al nmero que aparece a continuacin.   Por favor, tenga en cuenta que aunque hacemos todo lo posible para estar disponibles para asuntos urgentes fuera del horario de [place], no estamos disponibles las 24 horas del da, los 7 das de la [place].   Si tiene un problema urgente y no puede comunicarse con nosotros, puede optar por buscar atencin mdica  en el consultorio de su doctor(a), en una clnica privada, en un centro de atencin urgente o en una sala de emergencias.  Si tiene Engineering geologist, por favor llame inmediatamente al 911 o vaya a la sala de emergencias.  Nmeros de bper  - Dr. Nehemiah Massed: [phone]  - Dra. Moye: [phone]  - Dra. Nicole Kindred: [phone]  En caso de inclemencias del [place], por favor llame a Johnsie Kindred principal al [phone] para una actualizacin sobre el [place]  de cualquier retraso o cierre.  Consejos para la medicacin en dermatologa: Por favor, guarde las cajas en las que vienen los medicamentos de uso tpico para ayudarle a seguir las instrucciones sobre dnde y cmo usarlos. Las farmacias generalmente imprimen las instrucciones del medicamento slo en las cajas y no directamente en los tubos del [place].   Si su medicamento es muy caro, por favor, pngase en contacto con Zigmund Daniel llamando al [phone] y presione la opcin 4 o envenos un mensaje a travs de Pharmacist, community.   No podemos decirle cul ser su copago por los medicamentos por adelantado ya que esto es diferente dependiendo de la cobertura de su seguro. Sin embargo, es posible que podamos encontrar un medicamento sustituto a Electrical engineer un formulario para que el seguro cubra el medicamento que se considera necesario.   Si se requiere una autorizacin previa para que su compaa de seguros [place] su medicamento, por favor permtanos de 1 a 2 das hbiles para completar este proceso.  Los precios de los medicamentos varan con frecuencia dependiendo del Environmental consultant de dnde se surte la receta y alguna farmacias pueden ofrecer precios ms baratos.  El sitio web www.goodrx.com tiene cupones para medicamentos de Airline pilot. Los precios aqu no tienen en cuenta lo que podra costar con la ayuda del seguro (puede ser ms barato con su seguro), pero el sitio web puede darle el precio si no utiliz Research scientist (physical sciences).  - Puede imprimir el cupn correspondiente y llevarlo con su receta a la farmacia.  - Tambin puede pasar por nuestra oficina durante el horario de atencin regular y Charity fundraiser una tarjeta de cupones de GoodRx.  - Si necesita que su receta se enve electrnicamente a una farmacia diferente, informe a nuestra oficina a travs de MyChart de  o por telfono llamando al [phone] y presione la opcin 4.

## 2022-01-03 NOTE — Progress Notes (Signed)
Follow-Up Visit   Subjective  Kathy Knight is a 36 y.o. female who presents for the following: Annual Exam (The patient presents for Total-Body Skin Exam (TBSE) for skin cancer screening and mole check.  The patient has spots, moles and lesions to be evaluated, some may be new or changing and the patient has concerns that these could be cancer. Patient with no hx of skin cancer. ) and Acne (Patient using tretinoin 0.05% cream, acne well controlled. ).   The following portions of the chart were reviewed this encounter and updated as appropriate:   Tobacco  Allergies  Meds  Problems  Med Hx  Surg Hx  Fam Hx      Review of Systems:  No other skin or systemic complaints except as noted in HPI or Assessment and Plan.  Objective  Well appearing patient in no apparent distress; mood and affect are within normal limits.  A full examination was performed including scalp, head, eyes, ears, nose, lips, neck, chest, axillae, abdomen, back, buttocks, bilateral upper extremities, bilateral lower extremities, hands, feet, fingers, toes, fingernails, and toenails. All findings within normal limits unless otherwise noted below.  face Clear     Assessment & Plan  Acne vulgaris face  Chronic condition with duration or expected duration over one year. Currently well-controlled.  Continue tretinoin 0.05% cream qhs as tolerated.   Topical retinoid medications like tretinoin/Retin-A, adapalene/Differin, tazarotene/Fabior, and Epiduo/Epiduo Forte can cause dryness and irritation when first started. Only apply a pea-sized amount to the entire affected area. Avoid applying it around the eyes, edges of mouth and creases at the nose. If you experience irritation, use a good moisturizer first and/or apply the medicine less often. If you are doing well with the medicine, you can increase how often you use it until you are applying every night. Be careful with sun protection while using this medication as  it can make you sensitive to the sun. This medicine should not be used by pregnant women.    tretinoin (RETIN-A) 0.05 % cream - face Apply topically at bedtime.  Related Medications Dapsone (ACZONE) 7.5 % GEL Apply to entire face every morning.  tretinoin (RETIN-A) 0.05 % cream APPLY TOPICALLY AT BEDTIME.   Lentigines - Scattered tan macules - Due to sun exposure - Benign-appearing, observe - Recommend daily broad spectrum sunscreen SPF 30+ to sun-exposed areas, reapply every 2 hours as needed. - Call for any changes  Seborrheic Keratoses - Stuck-on, waxy, tan-brown papules and/or plaques  - Benign-appearing - Discussed benign etiology and prognosis. - Observe - Call for any changes  Melanocytic Nevi - Tan-brown and/or pink-flesh-colored symmetric macules and papules - Benign appearing on exam today - Observation - Call clinic for new or changing moles - Recommend daily use of broad spectrum spf 30+ sunscreen to sun-exposed areas.   Hemangiomas - Red papules - Discussed benign nature - Observe - Call for any changes  Actinic Damage - Chronic condition, secondary to cumulative UV/sun exposure - diffuse scaly erythematous macules with underlying dyspigmentation - Recommend daily broad spectrum sunscreen SPF 30+ to sun-exposed areas, reapply every 2 hours as needed.  - Staying in the shade or wearing long sleeves, sun glasses (UVA+UVB protection) and wide brim hats (4-inch brim around the entire circumference of the hat) are also recommended for sun protection.  - Call for new or changing lesions.  Skin cancer screening performed today.  Return in about 1 year (around 01/04/2023) for TBSE.  Graciella Belton, RMA, am acting  as scribe for Forest Gleason, MD .  Documentation: I have reviewed the above documentation for accuracy and completeness, and I agree with the above.  Forest Gleason, MD

## 2022-01-09 ENCOUNTER — Telehealth: Payer: 59 | Admitting: Physician Assistant

## 2022-01-09 DIAGNOSIS — J020 Streptococcal pharyngitis: Secondary | ICD-10-CM | POA: Diagnosis not present

## 2022-01-10 ENCOUNTER — Other Ambulatory Visit: Payer: Self-pay

## 2022-01-10 MED ORDER — PENICILLIN V POTASSIUM 500 MG PO TABS
500.0000 mg | ORAL_TABLET | Freq: Two times a day (BID) | ORAL | 0 refills | Status: AC
  Filled 2022-01-10: qty 20, 10d supply, fill #0

## 2022-01-10 NOTE — Progress Notes (Signed)
E-Visit for Sore Throat - Strep Symptoms  We are sorry that you are not feeling well.  Here is how we plan to help!  Based on what you have shared with me it is likely that you have strep pharyngitis.  Strep pharyngitis is inflammation and infection in the back of the throat.  This is an infection cause by bacteria and is treated with antibiotics.  I have prescribed Penicillin V 500 mg twice a day for 10 days. For throat pain, we recommend over the counter oral pain relief medications such as acetaminophen or aspirin, or anti-inflammatory medications such as ibuprofen or naproxen sodium. Topical treatments such as oral throat lozenges or sprays may be used as needed. Strep infections are not as easily transmitted as other respiratory infections, however we still recommend that you avoid close contact with loved ones, especially the very young and elderly.  Remember to wash your hands thoroughly throughout the day as this is the number one way to prevent the spread of infection and wipe down door knobs and counters with disinfectant.   Home Care: Only take medications as instructed by your medical team. Complete the entire course of an antibiotic. Do not take these medications with alcohol. A steam or ultrasonic humidifier can help congestion.  You can place a towel over your head and breathe in the steam from hot water coming from a faucet. Avoid close contacts especially the very young and the elderly. Cover your mouth when you cough or sneeze. Always remember to wash your hands.  Get Help Right Away If: You develop worsening fever or sinus pain. You develop a severe head ache or visual changes. Your symptoms persist after you have completed your treatment plan.  Make sure you Understand these instructions. Will watch your condition. Will get help right away if you are not doing well or get worse.   Thank you for choosing an e-visit.  Your e-visit answers were reviewed by a board  certified advanced clinical practitioner to complete your personal care plan. Depending upon the condition, your plan could have included both over the counter or prescription medications.  Please review your pharmacy choice. Make sure the pharmacy is open so you can pick up prescription now. If there is a problem, you may contact your provider through [employer] and have the prescription routed to another pharmacy.  Your safety is important to [place]. If you have drug allergies check your prescription carefully.   For the next 24 hours you can use MyChart to ask questions about today's visit, request a non-urgent call back, or ask for a work or school excuse. You will get an email in the next two days asking about your experience. I hope that your e-visit has been valuable and will speed your recovery.  I provided 5 minutes of non face-to-face time during this encounter for chart review and documentation.

## 2022-01-12 ENCOUNTER — Encounter: Payer: Self-pay | Admitting: Dermatology

## 2022-04-03 ENCOUNTER — Other Ambulatory Visit: Payer: Self-pay

## 2022-04-27 ENCOUNTER — Other Ambulatory Visit: Payer: Self-pay

## 2022-06-22 ENCOUNTER — Other Ambulatory Visit: Payer: Self-pay

## 2022-06-22 MED ORDER — ONDANSETRON 4 MG PO TBDP
4.0000 mg | ORAL_TABLET | Freq: Four times a day (QID) | ORAL | 0 refills | Status: DC | PRN
  Filled 2022-06-22: qty 20, 5d supply, fill #0

## 2022-06-22 MED ORDER — OXYCODONE HCL 5 MG PO TABS
5.0000 mg | ORAL_TABLET | ORAL | 0 refills | Status: DC | PRN
  Filled 2022-06-22: qty 15, 3d supply, fill #0

## 2022-06-22 MED ORDER — CEPHALEXIN 500 MG PO CAPS
500.0000 mg | ORAL_CAPSULE | Freq: Four times a day (QID) | ORAL | 0 refills | Status: DC
  Filled 2022-06-22: qty 20, 5d supply, fill #0

## 2022-06-30 ENCOUNTER — Telehealth: Payer: 59 | Admitting: Nurse Practitioner

## 2022-06-30 DIAGNOSIS — B028 Zoster with other complications: Secondary | ICD-10-CM | POA: Diagnosis not present

## 2022-06-30 MED ORDER — GABAPENTIN 300 MG PO CAPS: 300.0000 mg | ORAL_CAPSULE | Freq: Two times a day (BID) | ORAL | 0 refills | Status: DC

## 2022-06-30 MED ORDER — VALACYCLOVIR HCL 1 G PO TABS: 1000.0000 mg | ORAL_TABLET | Freq: Three times a day (TID) | ORAL | 0 refills | Status: AC

## 2022-06-30 NOTE — Progress Notes (Signed)
E-visit for Shingles   We are sorry that you are not feeling well. Here is how we plan to help!  Based on what you shared with me it looks like you have shingles.  Shingles or herpes zoster, is a common infection of the nerves.  It is a painful rash caused by the herpes zoster virus.  This is the same virus that causes chickenpox.  After a person has chickenpox, the virus remains inactive in the nerve cells.  Years later, the virus can become active again and travel to the skin.  It typically will appear on one side of the face or body.  Burning or shooting pain, tingling, or itching are early signs of the infection.  Blisters typically scab over in 7 to 10 days and clear up within 2-4 weeks. Shingles is only contagious to people that have never had the chickenpox, the chickenpox vaccine, or anyone who has a compromised immune system.  You should avoid contact with these type of people until your blisters scab over.  I have prescribed Valacyclovir 1g three times daily for 7 days and also Gabapentin '300mg'$  twice daily as needed for pain   HOME CARE: Apply ice packs (wrapped in a thin towel), cool compresses, or soak in cool bath to help reduce pain. Use calamine lotion to calm itchy skin. Avoid scratching the rash. Avoid direct sunlight.  GET HELP RIGHT AWAY IF: Symptoms that don't away after treatment. A rash or blisters near your eye. Increased drainage, fever, or rash after treatment. Severe pain that doesn't go away.   MAKE SURE YOU   Understand these instructions. Will watch your condition. Will get help right away if you are not doing well or get worse.  Thank you for choosing an e-visit.  Your e-visit answers were reviewed by a board certified advanced clinical practitioner to complete your personal care plan. Depending upon the condition, your plan could have included both over the counter or prescription medications.  Please review your pharmacy choice. Make sure the pharmacy  is open so you can pick up prescription now. If there is a problem, you may contact your provider through [employer] and have the prescription routed to another pharmacy.  Your safety is important to [place]. If you have drug allergies check your prescription carefully.   For the next 24 hours you can use MyChart to ask questions about today's visit, request a non-urgent call back, or ask for a work or school excuse. You will get an email in the next two days asking about your experience. I hope that your e-visit has been valuable and will speed your recovery.  Mary-Margaret Hassell Done, FNP   5-10 minutes spent reviewing and documenting in chart.

## 2022-07-26 ENCOUNTER — Other Ambulatory Visit: Payer: Self-pay

## 2022-09-10 ENCOUNTER — Other Ambulatory Visit: Payer: Self-pay

## 2022-10-08 ENCOUNTER — Other Ambulatory Visit: Payer: Self-pay | Admitting: Certified Nurse Midwife

## 2022-10-08 MED ORDER — NITROFURANTOIN MONOHYD MACRO 100 MG PO CAPS: 100.0000 mg | ORAL_CAPSULE | Freq: Two times a day (BID) | ORAL | 0 refills | Status: AC

## 2022-10-08 NOTE — Progress Notes (Signed)
Pt out of town , call with c/o uti. Requesting medication for treatment. Orders placed .   Doreene Burke, CNM

## 2022-11-13 ENCOUNTER — Ambulatory Visit (INDEPENDENT_AMBULATORY_CARE_PROVIDER_SITE_OTHER): Payer: 59 | Admitting: Licensed Practical Nurse

## 2022-11-13 ENCOUNTER — Other Ambulatory Visit (HOSPITAL_COMMUNITY)
Admission: RE | Admit: 2022-11-13 | Discharge: 2022-11-13 | Disposition: A | Discharge status: Home / Self Care | Payer: 59 | Source: Ambulatory Visit | Attending: Licensed Practical Nurse | Admitting: Licensed Practical Nurse

## 2022-11-13 ENCOUNTER — Encounter: Payer: Self-pay | Admitting: Licensed Practical Nurse

## 2022-11-13 VITALS — BP 118/74 | HR 91 | Wt 132.6 lb

## 2022-11-13 DIAGNOSIS — Z124 Encounter for screening for malignant neoplasm of cervix: Secondary | ICD-10-CM

## 2022-11-13 DIAGNOSIS — Z01419 Encounter for gynecological examination (general) (routine) without abnormal findings: Secondary | ICD-10-CM | POA: Insufficient documentation

## 2022-11-13 NOTE — Progress Notes (Signed)
Gynecology Annual Exam   PCP: Patient, No Pcp Per  Chief Complaint:  Chief Complaint  Patient presents with   Gynecologic Exam    History of Present Illness: Patient is a 37 y.o. G2P2002 presents for annual exam. The patient has no complaints today.   LMP: No LMP recorded. (Menstrual status: IUD).  Intermenstrual Bleeding: no Postcoital Bleeding: has occasionally  Dysmenorrhea: no  The patient is sexually active with 1 female. She currently uses IUD for contraception. Her IUD strings are not visible but an [place] in 2021 confirmed proper position. She denies dyspareunia.  The patient does perform self breast exams. Had a revision to her implants March 2024.  There is no notable family history of breast or ovarian cancer in her family. Grandmother's twin had breast CA   The patient wears seatbelts: yes.   The patient has regular exercise: yes.  5 to 6 days per week, has a home gym. 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    The patient denies current symptoms of depression.   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 (Menstrual status: IUD). Contraception: IUD placed on 2018, husband considering vasectomy. Would like another IUD next year for cycle control  Last Pap: Results were: no abnormalities 2021  Obstetric History:  G2P2002  Family History:  Family History  Problem Relation Age of Onset   Hyperlipidemia Mother    Thyroid disease Mother    Anxiety disorder Mother    Pulmonary fibrosis Father    Thyroid disease Maternal Aunt    Anxiety disorder Maternal Aunt    Heart disease Maternal Grandmother    Hyperlipidemia Maternal Grandmother    Heart disease Paternal Grandmother    Hyperlipidemia Paternal Grandmother    Other Son        congenital diaphragmatic hernia    Social History:  Social History   Socioeconomic History   Marital status: Married    Spouse name: Ronaldo Miyamoto   Number of children: 2   Years of education: Not on file   Highest education level: Not on file  Occupational History   Not on file  Tobacco Use   Smoking status: Never   Smokeless tobacco: Never  Vaping Use   Vaping status: Never Used  Substance and Sexual Activity   Alcohol use: No   Drug use: No   Sexual activity: Yes    Birth control/protection: I.U.D.    Comment: Mirena  Other Topics Concern   Not on file  Social History Narrative   Not on file   Social Determinants of Health   Financial Resource Strain: Not on file  Food Insecurity: Not on file  Transportation Needs: Not on file  Physical Activity: Sufficiently Active (12/04/2018)   Exercise Vital Sign    Days of  Exercise per Week: 6 days    Minutes of Exercise per Session: 60 min  Stress: No Stress Concern Present (12/04/2018)   [employer] of Occupational Health - Occupational Stress Questionnaire    Feeling of Stress : Only a little  Social Connections: Not on file  Intimate Partner Violence: Not on file    Allergies:  No Known Allergies  Medications: Prior to Admission medications   Medication Sig Start Date End Date Taking? Authorizing Provider  Dapsone (ACZONE) 7.5 % GEL Apply to entire face every morning. 01/05/21   Moy[place], [place], MD  gabapentin (NEURONTIN) 300 MG capsule Take 1 capsule (300 mg total) by mouth 2 (two) times daily.  06/30/22   Bennie Pierini, FNP  levonorgestrel (MIRENA) 20 MCG/24HR IUD 1 each by Intrauterine route once. 11/30/16   [provider]  oxyCODONE (OXY IR/ROXICODONE) 5 MG immediate release tablet Take 1 tablet (5 mg total) by mouth every 4 (four) hours as needed for 5 days. 06/22/22     tretinoin (RETIN-A) 0.05 % cream Apply topically at bedtime. 01/03/22 01/03/23  Sandi Mealy, MD    Physical Exam Vitals: There were no vitals taken for this visit.  General: NAD HEENT: normocephalic, anicteric Thyroid: no enlargement, no palpable nodules Pulmonary: No increased work of breathing, CTAB Cardiovascular: RRR, distal pulses 2+ Breast: Breast symmetrical, no tenderness, no palpable nodules or masses, no skin or nipple retraction present, no nipple discharge.  No axillary or supraclavicular lymphadenopathy. Scars well healed under both breasts.  Abdomen: NABS, soft, non-tender, non-distended.  Umbilicus without lesions.  No hepatomegaly, splenomegaly or masses palpable. No evidence of hernia  Genitourinary:  External: Normal external female genitalia.  Normal urethral meatus, normal Bartholin's and Skene's glands.    Vagina: Normal vaginal mucosa, no evidence of prolapse.    Cervix: Grossly normal in appearance, some bleeding when touched with pap brush. IUD strings not visible.   Uterus: Non-enlarged, mobile, normal contour.  No CMT  Adnexa: ovaries non-enlarged, no adnexal masses  Rectal: deferred  Lymphatic: no evidence of inguinal lymphadenopathy Extremities: no edema, erythema, or tenderness Neurologic: Grossly intact Psychiatric: mood appropriate, affect full   Assessment: 37 y.o. G2P2002 routine annual exam  Plan: Problem List Items Addressed This Visit   None Visit Diagnoses     Well woman exam    -  Primary   Relevant Orders   Cytology - PAP   Encounter for screening for cervical cancer       Relevant Orders   Cytology - PAP       2) STI screening  wasoffered  and declined  2)  ASCCP guidelines and rational discussed.  Patient opts for  every 2years  screening interval. Collected today   3) Contraception - the patient is currently using  IUD.  She is happy with her current form of contraception and plans to continue  4) Routine healthcare maintenance including cholesterol, diabetes screening discussed Declines will consider next year   5) RTC 1 year   Carie Caddy, CNM  [facility] 11/13/2022, 4:58 PM

## 2022-11-15 LAB — CYTOLOGY - PAP
Diagnosis: NEGATIVE
High risk HPV: NEGATIVE

## 2023-01-09 ENCOUNTER — Encounter: Payer: 59 | Admitting: Dermatology

## 2023-01-17 ENCOUNTER — Encounter: Payer: Self-pay | Admitting: Dermatology

## 2023-01-17 ENCOUNTER — Other Ambulatory Visit: Payer: Self-pay

## 2023-01-17 ENCOUNTER — Ambulatory Visit (INDEPENDENT_AMBULATORY_CARE_PROVIDER_SITE_OTHER): Payer: 59 | Admitting: Dermatology

## 2023-01-17 VITALS — BP 123/80 | HR 74

## 2023-01-17 DIAGNOSIS — W908XXA Exposure to other nonionizing radiation, initial encounter: Secondary | ICD-10-CM

## 2023-01-17 DIAGNOSIS — L578 Other skin changes due to chronic exposure to nonionizing radiation: Secondary | ICD-10-CM

## 2023-01-17 DIAGNOSIS — L700 Acne vulgaris: Secondary | ICD-10-CM

## 2023-01-17 DIAGNOSIS — L814 Other melanin hyperpigmentation: Secondary | ICD-10-CM | POA: Diagnosis not present

## 2023-01-17 DIAGNOSIS — Z1283 Encounter for screening for malignant neoplasm of skin: Secondary | ICD-10-CM

## 2023-01-17 DIAGNOSIS — D1801 Hemangioma of skin and subcutaneous tissue: Secondary | ICD-10-CM

## 2023-01-17 DIAGNOSIS — D2261 Melanocytic nevi of right upper limb, including shoulder: Secondary | ICD-10-CM | POA: Diagnosis not present

## 2023-01-17 DIAGNOSIS — D229 Melanocytic nevi, unspecified: Secondary | ICD-10-CM

## 2023-01-17 MED ORDER — TRETINOIN 0.05 % EX CREA
TOPICAL_CREAM | Freq: Every day | CUTANEOUS | 11 refills | Status: DC
  Filled 2023-01-17: qty 45, 30d supply, fill #0

## 2023-01-17 NOTE — Patient Instructions (Addendum)
Topical retinoid medications like tretinoin/Retin-A, adapalene/Differin, tazarotene/Fabior, and Epiduo/Epiduo Forte can cause dryness and irritation when first started. Only apply a pea-sized amount to the entire affected area. Avoid applying it around the eyes, edges of mouth and creases at the nose. If you experience irritation, use a good moisturizer first and/or apply the medicine less often. If you are doing well with the medicine, you can increase how often you use it until you are applying every night. Be careful with sun protection while using this medication as it can make you sensitive to the sun. This medicine should not be used by pregnant women.      Due to recent changes in healthcare laws, you may see results of your pathology and/or laboratory studies on MyChart before the doctors have had a chance to review them. We understand that in some cases there may be results that are confusing or concerning to you. Please understand that not all results are received at the same time and often the doctors may need to interpret multiple results in order to provide you with the best plan of care or course of treatment. Therefore, we ask that you please give [place] 2 business days to thoroughly review all your results before contacting the office for clarification. Should we see a critical lab result, you will be contacted sooner.   If You Need Anything After Your Visit  If you have any questions or concerns for your doctor, please call our main line at [phone] and press option 4 to reach your doctor's medical assistant. If no one answers, please leave a voicemail as directed and we will return your call as soon as possible. Messages left after 4 pm will be answered the following business day.   You may also send [place] a message via MyChart. We typically respond to MyChart messages within 1-2 business days.  For prescription refills, please ask your pharmacy to contact our office. Our fax number is  [phone].  If you have an urgent issue when the clinic is closed that cannot wait until the next business day, you can page your doctor at the number below.    Please note that while we do our best to be available for urgent issues outside of office hours, we are not available 24/7.   If you have an urgent issue and are unable to reach [place], you may choose to seek medical care at your doctor's office, retail clinic, urgent care center, or emergency room.  If you have a medical emergency, please immediately call 911 or go to the emergency department.  Pager Numbers  - Dr. Gwen Pounds: [phone]  - Dr. Roseanne Reno: [phone]  - Dr. Katrinka Blazing: [phone]   In the event of inclement weather, please call our main line at [phone] for an update on the status of any delays or closures.  Dermatology Medication Tips: Please keep the boxes that topical medications come in in order to help keep track of the instructions about where and how to use these. Pharmacies typically print the medication instructions only on the boxes and not directly on the medication tubes.   If your medication is too expensive, please contact our office at [phone] option 4 or send [place] a message through MyChart.   We are unable to tell what your co-pay for medications will be in advance as this is different depending on your insurance coverage. However, we may be able to find a substitute medication at lower cost or fill out paperwork to get insurance  to cover a needed medication.   If a prior authorization is required to get your medication covered by your insurance company, please allow [place] 1-2 business days to complete this process.  Drug prices often vary depending on where the prescription is filled and some pharmacies may offer cheaper prices.  The website www.goodrx.com contains coupons for medications through different pharmacies. The prices here do not account for what the cost may be with help from  insurance (it may be cheaper with your insurance), but the website can give you the price if you did not use any insurance.  - You can print the associated coupon and take it with your prescription to the pharmacy.  - You may also stop by our office during regular business hours and pick up a GoodRx coupon card.  - If you need your prescription sent electronically to a different pharmacy, notify our office through [facility] or by phone at [phone] option 4.     Si Usted Necesita Algo Despus de Su Visita  Tambin puede enviarnos un mensaje a travs de Clinical cytogeneticist. Por lo general respondemos a los mensajes de MyChart en el transcurso de 1 a 2 das hbiles.  Para renovar recetas, por favor pida a su farmacia que se ponga en contacto con nuestra oficina. Annie Sable de fax es [place] [phone].  Si tiene un asunto urgente cuando la clnica est cerrada y que no puede esperar hasta el siguiente da hbil, puede llamar/localizar a su doctor(a) al nmero que aparece a continuacin.   Por favor, tenga en cuenta que aunque hacemos todo lo posible para estar disponibles para asuntos urgentes fuera del horario de [place], no estamos disponibles las 24 horas del da, los 7 [address]  [address] de la [place].   Si tiene un problema urgente y no puede comunicarse con nosotros, puede optar por buscar atencin mdica  en el consultorio de su doctor(a), en una clnica privada, en un centro de atencin urgente o en una sala de emergencias.  Si tiene Engineer, drilling, por favor llame inmediatamente al 911 o vaya a la sala de emergencias.  Nmeros de bper  - Dr. Gwen Pounds: [phone]  - Dra. Roseanne Reno: [phone]  - Dr. Katrinka Blazing: [phone]   En caso de inclemencias del tiempo, por favor llame a Lacy Duverney principal al [phone] para una actualizacin sobre el [place] de cualquier retraso o cierre.  Consejos para la medicacin en dermatologa: Por favor, guarde las cajas en las que vienen los  medicamentos de uso tpico para ayudarle a seguir las instrucciones sobre dnde y cmo usarlos. Las farmacias generalmente imprimen las instrucciones del medicamento slo en las cajas y no directamente en los tubos del [place].   Si su medicamento es muy caro, por favor, pngase en contacto con Rolm [place]ala llamando al [phone] y presione la opcin 4 o envenos un mensaje a travs de Clinical cytogeneticist.   No podemos decirle cul ser su copago por los medicamentos por adelantado ya que esto es diferente dependiendo de la cobertura de su seguro. Sin embargo, es posible que podamos encontrar un medicamento sustituto a Audiological scientist un formulario para que el seguro cubra el medicamento que se considera necesario.   Si se requiere una autorizacin previa para que su compaa de seguros [place] su medicamento, por favor permtanos de 1 a 2 das hbiles para completar [address]  Los precios de los medicamentos varan con frecuencia dependiendo del Environmental consultant de dnde se surte la receta y Careers adviser pueden ofrecer  precios ms baratos.  El sitio web www.goodrx.com tiene cupones para medicamentos de Health and safety inspector. Los precios aqu no tienen en cuenta lo que podra costar con la ayuda del seguro (puede ser ms barato con su seguro), pero el sitio web puede darle el precio si no utiliz Tourist information centre manager.  - Puede imprimir el cupn correspondiente y llevarlo con su receta a la farmacia.  - Tambin puede pasar por nuestra oficina durante el horario de atencin regular y Education officer, museum una tarjeta de cupones de GoodRx.  - Si necesita que su receta se enve electrnicamente a una farmacia diferente, informe a nuestra oficina a travs de MyChart de [place] o por telfono llamando al [phone] y presione la opcin 4.

## 2023-01-17 NOTE — Progress Notes (Signed)
Follow-Up Visit   Subjective  Kathy Knight is a 37 y.o. female who presents for the following: Skin Cancer Screening and Full Body Skin Exam  The patient presents for Total-Body Skin Exam (TBSE) for skin cancer screening and mole check. The patient has spots, moles and lesions to be evaluated, some may be new or changing and the patient may have concern these could be cancer.  The following portions of the chart were reviewed this encounter and updated as appropriate: medications, allergies, medical history  Review of Systems:  No other skin or systemic complaints except as noted in HPI or Assessment and Plan.  Objective  Well appearing patient in no apparent distress; mood and affect are within normal limits.  A full examination was performed including scalp, head, eyes, ears, nose, lips, neck, chest, axillae, abdomen, back, buttocks, bilateral upper extremities, bilateral lower extremities, hands, feet, fingers, toes, fingernails, and toenails. All findings within normal limits unless otherwise noted below.   Relevant physical exam findings are noted in the Assessment and Plan.   Assessment & Plan   SKIN CANCER SCREENING PERFORMED TODAY.  ACTINIC DAMAGE - Chronic condition, secondary to cumulative UV/sun exposure - diffuse scaly erythematous macules with underlying dyspigmentation - Recommend daily broad spectrum sunscreen SPF 30+ to sun-exposed areas, reapply every 2 hours as needed.  - Staying in the shade or wearing long sleeves, sun glasses (UVA+UVB protection) and wide brim hats (4-inch brim around the entire circumference of the hat) are also recommended for sun protection.  - Call for new or changing lesions.  LENTIGINES, HEMANGIOMAS - Benign normal skin lesions - Benign-appearing - Call for any changes  MELANOCYTIC NEVI - Tan-brown and/or pink-flesh-colored symmetric macules and papules - Benign appearing on exam today - Observation - Call clinic for new or  changing moles - Recommend daily use of broad spectrum spf 30+ sunscreen to sun-exposed areas.   ACNE VULGARIS Exam: L cheek 2 resolving papules, otherwise clear.  Chronic condition with duration or expected duration over one year. Currently well-controlled.  Treatment Plan: Continue Tretinoin 0.05% cream QHS. Topical retinoid medications like tretinoin/Retin-A, adapalene/Differin, tazarotene/Fabior, and Epiduo/Epiduo Forte can cause dryness and irritation when first started. Only apply a pea-sized amount to the entire affected area. Avoid applying it around the eyes, edges of mouth and creases at the nose. If you experience irritation, use a good moisturizer first and/or apply the medicine less often. If you are doing well with the medicine, you can increase how often you use it until you are applying every night. Be careful with sun protection while using this medication as it can make you sensitive to the sun. This medicine should not be used by pregnant women.   MELANOCYTIC NEVUS Exam: Brown macule of the R shoulder, stable compared to photo. Treatment Plan: Benign appearing on exam today. Recommend observation. Call clinic for new or changing moles. Recommend daily use of broad spectrum spf 30+ sunscreen to sun-exposed areas.    HEMANGIOMA Exam: red papule of the L leg  Discussed benign nature. Recommend observation. Call for changes. Discussed treatment options of laser vs shave removal and ED since patient cuts when shaving. Patient deferred treatment at this time.  Return in about 1 year (around 01/17/2024) for TBSE and acne follow up.  Maylene Roes, CMA, am acting as scribe for Elie Goody, MD .   Documentation: I have reviewed the above documentation for accuracy and completeness, and I agree with the above.  Elie Goody, MD

## 2023-01-18 ENCOUNTER — Other Ambulatory Visit: Payer: Self-pay

## 2023-02-25 DIAGNOSIS — H5213 Myopia, bilateral: Secondary | ICD-10-CM | POA: Diagnosis not present

## 2023-09-23 ENCOUNTER — Other Ambulatory Visit: Payer: Self-pay

## 2023-09-23 ENCOUNTER — Telehealth: Admitting: Physician Assistant

## 2023-09-23 DIAGNOSIS — J019 Acute sinusitis, unspecified: Secondary | ICD-10-CM | POA: Diagnosis not present

## 2023-09-23 DIAGNOSIS — B9689 Other specified bacterial agents as the cause of diseases classified elsewhere: Secondary | ICD-10-CM

## 2023-09-23 MED ORDER — AMOXICILLIN-POT CLAVULANATE 875-125 MG PO TABS
1.0000 | ORAL_TABLET | Freq: Two times a day (BID) | ORAL | 0 refills | Status: AC
  Filled 2023-09-23: qty 14, 7d supply, fill #0

## 2023-09-23 NOTE — Progress Notes (Signed)

## 2024-01-21 ENCOUNTER — Ambulatory Visit: Payer: 59 | Admitting: Dermatology

## 2024-01-22 ENCOUNTER — Other Ambulatory Visit: Payer: Self-pay

## 2024-01-22 ENCOUNTER — Ambulatory Visit: Admitting: Dermatology

## 2024-01-22 ENCOUNTER — Ambulatory Visit: Payer: 59 | Admitting: Dermatology

## 2024-01-22 ENCOUNTER — Other Ambulatory Visit: Payer: Self-pay | Admitting: Dermatology

## 2024-01-22 DIAGNOSIS — L700 Acne vulgaris: Secondary | ICD-10-CM | POA: Diagnosis not present

## 2024-01-22 DIAGNOSIS — L814 Other melanin hyperpigmentation: Secondary | ICD-10-CM | POA: Diagnosis not present

## 2024-01-22 DIAGNOSIS — R21 Rash and other nonspecific skin eruption: Secondary | ICD-10-CM | POA: Diagnosis not present

## 2024-01-22 DIAGNOSIS — Z1283 Encounter for screening for malignant neoplasm of skin: Secondary | ICD-10-CM | POA: Diagnosis not present

## 2024-01-22 DIAGNOSIS — L578 Other skin changes due to chronic exposure to nonionizing radiation: Secondary | ICD-10-CM | POA: Diagnosis not present

## 2024-01-22 DIAGNOSIS — D225 Melanocytic nevi of trunk: Secondary | ICD-10-CM

## 2024-01-22 DIAGNOSIS — D489 Neoplasm of uncertain behavior, unspecified: Secondary | ICD-10-CM | POA: Diagnosis not present

## 2024-01-22 DIAGNOSIS — D2261 Melanocytic nevi of right upper limb, including shoulder: Secondary | ICD-10-CM | POA: Diagnosis not present

## 2024-01-22 DIAGNOSIS — Z79899 Other long term (current) drug therapy: Secondary | ICD-10-CM

## 2024-01-22 DIAGNOSIS — D2272 Melanocytic nevi of left lower limb, including hip: Secondary | ICD-10-CM | POA: Diagnosis not present

## 2024-01-22 DIAGNOSIS — W908XXA Exposure to other nonionizing radiation, initial encounter: Secondary | ICD-10-CM | POA: Diagnosis not present

## 2024-01-22 DIAGNOSIS — D229 Melanocytic nevi, unspecified: Secondary | ICD-10-CM

## 2024-01-22 DIAGNOSIS — D239 Other benign neoplasm of skin, unspecified: Secondary | ICD-10-CM

## 2024-01-22 DIAGNOSIS — D1801 Hemangioma of skin and subcutaneous tissue: Secondary | ICD-10-CM

## 2024-01-22 DIAGNOSIS — Z7189 Other specified counseling: Secondary | ICD-10-CM

## 2024-01-22 HISTORY — DX: Other benign neoplasm of skin, unspecified: D23.9

## 2024-01-22 MED ORDER — TRETINOIN 0.05 % EX CREA
TOPICAL_CREAM | Freq: Every day | CUTANEOUS | 11 refills | Status: AC
  Filled 2024-01-22: qty 45, 30d supply, fill #0

## 2024-01-22 NOTE — Patient Instructions (Addendum)
 Wound Care Instructions  Cleanse wound gently with soap and water once a day then pat dry with clean gauze. Apply a thin coat of Petrolatum (petroleum jelly, Vaseline) over the wound (unless you have an allergy to this). We recommend that you use a new, sterile tube of Vaseline. Do not pick or remove scabs. Do not remove the yellow or white healing tissue from the base of the wound.  Cover the wound with fresh, clean, nonstick gauze and secure with paper tape. You may use Band-Aids in place of gauze and tape if the wound is small enough, but would recommend trimming much of the tape off as there is often too much. Sometimes Band-Aids can irritate the skin.  You should call the office for your biopsy report after 1 week if you have not already been contacted.  If you experience any problems, such as abnormal amounts of bleeding, swelling, significant bruising, significant pain, or evidence of infection, please call the office immediately.  FOR ADULT SURGERY PATIENTS: If you need something for pain relief you may take 1 extra strength Tylenol  (acetaminophen ) AND 2 Ibuprofen  (200mg  each) together every 4 hours as needed for pain. (do not take these if you are allergic to them or if you have a reason you should not take them.) Typically, you may only need pain medication for 1 to 3 days.      Recommend daily broad spectrum sunscreen SPF 30+ to sun-exposed areas, reapply every 2 hours as needed. Call for new or changing lesions.  Staying in the shade or wearing long sleeves, sun glasses (UVA+UVB protection) and wide brim hats (4-inch brim around the entire circumference of the hat) are also recommended for sun protection.    Melanoma ABCDEs  Melanoma is the most dangerous type of skin cancer, and is the leading cause of death from skin disease.  You are more likely to develop melanoma if you: Have light-colored skin, light-colored eyes, or red or blond hair Spend a lot of time in the sun Tan  regularly, either outdoors or in a tanning bed Have had blistering sunburns, especially during childhood Have a close family member who has had a melanoma Have atypical moles or large birthmarks  Early detection of melanoma is key since treatment is typically straightforward and cure rates are extremely high if we catch it early.   The first sign of melanoma is often a change in a mole or a new dark spot.  The ABCDE system is a way of remembering the signs of melanoma.  A for asymmetry:  The two halves do not match. B for border:  The edges of the growth are irregular. C for color:  A mixture of colors are present instead of an even brown color. D for diameter:  Melanomas are usually (but not always) greater than 6mm - the size of a pencil eraser. E for evolution:  The spot keeps changing in size, shape, and color.  Please check your skin once per month between visits. You can use a small mirror in front and a large mirror behind you to keep an eye on the back side or your body.   If you see any new or changing lesions before your next follow-up, please call to schedule a visit.  Please continue daily skin protection including broad spectrum sunscreen SPF 30+ to sun-exposed areas, reapplying every 2 hours as needed when you're outdoors.    Due to recent changes in healthcare laws, you may see results of your  pathology and/or laboratory studies on MyChart before the doctors have had a chance to review them. We understand that in some cases there may be results that are confusing or concerning to you. Please understand that not all results are received at the same time and often the doctors may need to interpret multiple results in order to provide you with the best plan of care or course of treatment. Therefore, we ask that you please give us  2 business days to thoroughly review all your results before contacting the office for clarification. Should we see a critical lab result, you will be  contacted sooner.   If You Need Anything After Your Visit  If you have any questions or concerns for your doctor, please call our main line at [phone] and press option 4 to reach your doctor's medical assistant. If no one answers, please leave a voicemail as directed and we will return your call as soon as possible. Messages left after 4 pm will be answered the following business day.   You may also send us  a message via MyChart. We typically respond to MyChart messages within 1-2 business days.  For prescription refills, please ask your pharmacy to contact our office. Our fax number is [phone].  If you have an urgent issue when the clinic is closed that cannot wait until the next business day, you can page your doctor at the number below.    Please note that while we do our best to be available for urgent issues outside of office hours, we are not available 24/7.   If you have an urgent issue and are unable to reach us , you may choose to seek medical care at your doctor's office, retail clinic, urgent care center, or emergency room.  If you have a medical emergency, please immediately call 911 or go to the emergency department.  Pager Numbers  - Dr. Hester: [phone]  - Dr. Jackquline: [phone]  - Dr. Claudene: [phone]   - Dr. Raymund: [phone]  In the event of inclement weather, please call our main line at [phone] for an update on the status of any delays or closures.  Dermatology Medication Tips: Please keep the boxes that topical medications come in in order to help keep track of the instructions about where and how to use these. Pharmacies typically print the medication instructions only on the boxes and not directly on the medication tubes.   If your medication is too expensive, please contact our office at [phone] option 4 or send us  a message through MyChart.   We are unable to tell what your co-pay for medications will be in advance as this  is different depending on your insurance coverage. However, we may be able to find a substitute medication at lower cost or fill out paperwork to get insurance to cover a needed medication.   If a prior authorization is required to get your medication covered by your insurance company, please allow us  1-2 business days to complete this process.  Drug prices often vary depending on where the prescription is filled and some pharmacies may offer cheaper prices.  The website www.goodrx.com contains coupons for medications through different pharmacies. The prices here do not account for what the cost may be with help from insurance (it may be cheaper with your insurance), but the website can give you the price if you did not use any insurance.  - You can print the associated coupon and take it with your prescription to the pharmacy.  -  You may also stop by our office during regular business hours and pick up a GoodRx coupon card.  - If you need your prescription sent electronically to a different pharmacy, notify our office through [facility] or by phone at [phone] option 4.     Si Usted Necesita Algo Despus de Su Visita  Tambin puede enviarnos un mensaje a travs de Clinical cytogeneticist. Por lo general respondemos a los mensajes de MyChart en el transcurso de 1 a 2 das hbiles.  Para renovar recetas, por favor pida a su farmacia que se ponga en contacto con nuestra oficina. Randi lakes de fax es [place] [phone].  Si tiene un asunto urgente cuando la clnica est cerrada y que no puede esperar hasta el siguiente da hbil, puede llamar/localizar a su doctor(a) al nmero que aparece a continuacin.   Por favor, tenga en cuenta que aunque hacemos todo lo posible para estar disponibles para asuntos urgentes fuera del horario de [place], no estamos disponibles las 24 horas del da, los 7 [address]  [address] de la [place].   Si tiene un problema urgente y no puede comunicarse con nosotros, puede optar por  buscar atencin mdica  en el consultorio de su doctor(a), en una clnica privada, en un centro de atencin urgente o en una sala de emergencias.  Si tiene Engineer, drilling, por favor llame inmediatamente al 911 o vaya a la sala de emergencias.  Nmeros de bper  - Dr. Hester: [phone]  - Dra. Jackquline: [phone]  - Dr. Claudene: [phone]  - Dra. Kitts: [phone]  En caso de inclemencias del [place], por favor llame a nuestra lnea principal al [phone] para una actualizacin sobre el estado de cualquier retraso o cierre.  Consejos para la medicacin en dermatologa: Por favor, guarde las cajas en las que vienen los medicamentos de uso tpico para ayudarle a seguir las instrucciones sobre dnde y cmo usarlos. Las farmacias generalmente imprimen las instrucciones del medicamento slo en las cajas y no directamente en los tubos del [place].   Si su medicamento es muy caro, por favor, pngase en contacto con landry rieger llamando al [phone] y presione la opcin 4 o envenos un mensaje a travs de Clinical cytogeneticist.   No podemos decirle cul ser su copago por los medicamentos por adelantado ya que esto es diferente dependiendo de la cobertura de su seguro. Sin embargo, es posible que podamos encontrar un medicamento sustituto a Audiological scientist un formulario para que el seguro cubra el medicamento que se considera necesario.   Si se requiere una autorizacin previa para que su compaa de seguros malta su medicamento, por favor permtanos de 1 a 2 das hbiles para completar este proceso.  Los precios de los medicamentos varan con frecuencia dependiendo del Environmental consultant de dnde se surte la receta y alguna farmacias pueden ofrecer precios ms baratos.  El sitio web www.goodrx.com tiene cupones para medicamentos de Health and safety inspector. Los precios aqu no tienen en cuenta lo que podra costar con la ayuda del seguro (puede ser ms barato con su seguro), pero el sitio web  puede darle el precio si no utiliz Tourist information centre manager.  - Puede imprimir el cupn correspondiente y llevarlo con su receta a la farmacia.  - Tambin puede pasar por nuestra oficina durante el horario de atencin regular y Education officer, museum una tarjeta de cupones de GoodRx.  - Si necesita que su receta se enve electrnicamente a Psychiatrist, informe a nuestra oficina a travs de MyChart de [employer] o por  telfono llamando al [phone] y presione la opcin 4.

## 2024-01-22 NOTE — Progress Notes (Unsigned)
 Follow-Up Visit   Subjective  Kathy Knight is a 38 y.o. female who presents for the following: Skin Cancer Screening and Full Body Skin Exam; no hx of skin cancer. Patient reports no areas of concern at this time.   The patient presents for Total-Body Skin Exam (TBSE) for skin cancer screening and mole check. The patient has spots, moles and lesions to be evaluated, some may be new or changing and the patient may have concern these could be cancer.  The following portions of the chart were reviewed this encounter and updated as appropriate: medications, allergies, medical history  Review of Systems:  No other skin or systemic complaints except as noted in HPI or Assessment and Plan.  Objective  Well appearing patient in no apparent distress; mood and affect are within normal limits.  A full examination was performed including scalp, head, eyes, ears, nose, lips, neck, chest, axillae, abdomen, back, buttocks, bilateral upper extremities, bilateral lower extremities, hands, feet, fingers, toes, fingernails, and toenails. All findings within normal limits unless otherwise noted below.   Relevant physical exam findings are noted in the Assessment and Plan.  Left  mid back 8cm lateral to the spine 0.6cm brown papule  Right mid to low Back 4cm lateral to the spine 0.6cm brown papule  right top shoulder 0.5cm brown papule  Assessment & Plan   SKIN CANCER SCREENING PERFORMED TODAY.  ACTINIC DAMAGE - Chronic condition, secondary to cumulative UV/sun exposure - diffuse scaly erythematous macules with underlying dyspigmentation - Recommend daily broad spectrum sunscreen SPF 30+ to sun-exposed areas, reapply every 2 hours as needed.  - Staying in the shade or wearing long sleeves, sun glasses (UVA+UVB protection) and wide brim hats (4-inch brim around the entire circumference of the hat) are also recommended for sun protection.  - Call for new or changing lesions.  LENTIGINES, SEBORRHEIC  KERATOSES, HEMANGIOMAS - Benign normal skin lesions - Benign-appearing - Call for any changes  MELANOCYTIC NEVI - Tan-brown and/or pink-flesh-colored symmetric macules and papules - Benign appearing on exam today - Observation - Call clinic for new or changing moles - Recommend daily use of broad spectrum spf 30+ sunscreen to sun-exposed areas.   ACNE VULGARIS Exam: clear in office  Chronic condition with duration or expected duration over one year. Currently well-controlled. Treatment Plan: Continue Tretinoin  0.05% cream QHS. Topical retinoid medications like tretinoin /Retin-A , adapalene/Differin, tazarotene/Fabior, and Epiduo/Epiduo Forte can cause dryness and irritation when first started. Only apply a pea-sized amount to the entire affected area. Avoid applying it around the eyes, edges of mouth and creases at the nose. If you experience irritation, use a good moisturizer first and/or apply the medicine less often. If you are doing well with the medicine, you can increase how often you use it until you are applying every night. Be careful with sun protection while using this medication as it can make you sensitive to the sun. This medicine should not be used by pregnant women.   HEMANGIOMA Exam: red papule of the L leg  Discussed benign nature. Recommend observation. Call for changes. Discussed treatment options of laser vs shave removal and ED since patient cuts when shaving. Patient deferred treatment at this time.  Rash  - has occurred a few times in life and resolves in a few days. Eczema vs Allergic dermatitis  - Crusted patch R cheek  - Recommend OTC Hydrocortisone PRN for itching - Patient deferred treatment   MELANOCYTIC NEVUS Exam: Brown macule of the R shoulder, stable  compared to photo. Three brown macules on there periumbilical abdomen One brown macule left dorsal foot 0.46mm Treatment Plan: Benign appearing on exam today. Recommend observation. Call clinic for new or  changing moles. Recommend daily use of broad spectrum spf 30+ sunscreen to sun-exposed areas.    NEOPLASM OF UNCERTAIN BEHAVIOR (3) Left  mid back Epidermal / dermal shaving  Lesion diameter (cm):  0.6 Informed consent: discussed and consent obtained   Timeout: patient name, date of birth, surgical site, and procedure verified   Patient was prepped and draped in usual sterile fashion: area prepped with alcohol. Anesthesia: the lesion was anesthetized in a standard fashion   Anesthetic:  1% lidocaine w/ epinephrine 1-100,000 local infiltration Instrument used: flexible razor blade   Hemostasis achieved with: pressure, aluminum chloride and electrodesiccation   Outcome: patient tolerated procedure well   Post-procedure details: wound care instructions given   Post-procedure details comment:  Ointment and a small bandage applied  Specimen 1 - Surgical pathology Differential Diagnosis: Nevus vs dysplastic nevus   Check Margins: No Right mid to low Back Epidermal / dermal shaving  Lesion diameter (cm):  0.6 Informed consent: discussed and consent obtained   Timeout: patient name, date of birth, surgical site, and procedure verified   Patient was prepped and draped in usual sterile fashion: area prepped with alcohol. Anesthesia: the lesion was anesthetized in a standard fashion   Anesthetic:  1% lidocaine w/ epinephrine 1-100,000 local infiltration Instrument used: flexible razor blade   Hemostasis achieved with: pressure, aluminum chloride and electrodesiccation   Outcome: patient tolerated procedure well   Post-procedure details: wound care instructions given   Post-procedure details comment:  Ointment and a small bandage applied  Specimen 2 - Surgical pathology Differential Diagnosis: Nevus vs dysplastic nevus   Check Margins: No right top shoulder Epidermal / dermal shaving  Lesion diameter (cm):  0.5 Informed consent: discussed and consent obtained   Timeout: patient  name, date of birth, surgical site, and procedure verified   Patient was prepped and draped in usual sterile fashion: area prepped with alcohol. Anesthesia: the lesion was anesthetized in a standard fashion   Anesthetic:  1% lidocaine w/ epinephrine 1-100,000 local infiltration Instrument used: flexible razor blade   Hemostasis achieved with: pressure, aluminum chloride and electrodesiccation   Outcome: patient tolerated procedure well   Post-procedure details: wound care instructions given   Post-procedure details comment:  Ointment and a small bandage applied  Specimen 3 - Surgical pathology Differential Diagnosis: Nevus vs dysplastic nevus   Check Margins: No SKIN CANCER SCREENING   ACTINIC SKIN DAMAGE   LENTIGO   MELANOCYTIC NEVUS, UNSPECIFIED LOCATION   ACNE VULGARIS   HEMANGIOMA OF SKIN   RASH   COUNSELING AND COORDINATION OF CARE   MEDICATION MANAGEMENT   Return in about 1 year (around 01/21/2025) for TBSE.  I, Emerick Ege, CMA am acting as scribe Alm Rhyme, MD.    Documentation: I have reviewed the above documentation for accuracy and completeness, and I agree with the above.  Alm Rhyme, MD

## 2024-01-23 ENCOUNTER — Encounter: Payer: Self-pay | Admitting: Dermatology

## 2024-01-23 LAB — DERMATOLOGY PATHOLOGY

## 2024-01-27 ENCOUNTER — Ambulatory Visit: Payer: Self-pay | Admitting: Dermatology

## 2024-01-28 ENCOUNTER — Encounter: Payer: Self-pay | Admitting: Dermatology

## 2024-01-28 NOTE — Telephone Encounter (Addendum)
 Called and discussed bx results with patient. She verbalized understanding and denied further questions. Will recheck areas at next follow up. ----- Message from Alm Rhyme sent at 01/27/2024  6:59 PM EDT ----- FINAL DIAGNOSIS        1. Skin, left mid back :       DYSPLASTIC COMPOUND NEVUS WITH MODERATE ATYPIA, LIMITED MARGINS FREE        2. Skin, right mid to low back :       DYSPLASTIC COMPOUND NEVUS WITH MODERATE ATYPIA, DEEP MARGIN INVOLVED        3. Skin, right top shoulder :       DYSPLASTIC COMPOUND NEVUS WITH MODERATE ATYPIA, LIMITED MARGINS FREE   1,2,3 - All three Moderate Dysplastic Recheck next visit    ----- Message ----- From: Interface, Lab In Three Zero One Sent: 01/27/2024   1:55 PM EDT To: Alm JAYSON Rhyme, MD

## 2024-01-31 ENCOUNTER — Other Ambulatory Visit: Payer: Self-pay

## 2024-04-28 ENCOUNTER — Other Ambulatory Visit: Payer: Self-pay

## 2024-04-28 ENCOUNTER — Other Ambulatory Visit (HOSPITAL_COMMUNITY): Payer: Self-pay

## 2024-04-28 MED ORDER — NEOMYCIN-POLYMYXIN-DEXAMETH 3.5-10000-0.1 OP SUSP
1.0000 [drp] | Freq: Every day | OPHTHALMIC | 0 refills | Status: AC
  Filled 2024-04-28 (×2): qty 5, 90d supply, fill #0

## 2025-01-21 ENCOUNTER — Ambulatory Visit: Admitting: Dermatology
